# Patient Record
Sex: Male | Born: 1995 | Race: White | Hispanic: No | Smoking: Current every day smoker
Health system: Southern US, Community
[De-identification: ages and names within clinical notes are randomized; demographics above are authoritative.]

## PROBLEM LIST (undated history)

## (undated) HISTORY — PX: BACK SURGERY: SHX140

---

## 2020-05-14 ENCOUNTER — Encounter (HOSPITAL_COMMUNITY): Payer: Self-pay | Admitting: Emergency Medicine

## 2020-05-14 ENCOUNTER — Emergency Department (HOSPITAL_COMMUNITY): Payer: BC Managed Care – PPO

## 2020-05-14 ENCOUNTER — Inpatient Hospital Stay (HOSPITAL_COMMUNITY)
Admission: EM | Admit: 2020-05-14 | Discharge: 2020-05-25 | DRG: 029 | Disposition: A | Discharge status: Rehabilitation Facility | Payer: BC Managed Care – PPO | Attending: Surgery | Admitting: Surgery

## 2020-05-14 DIAGNOSIS — S32028A Other fracture of second lumbar vertebra, initial encounter for closed fracture: Secondary | ICD-10-CM | POA: Diagnosis present

## 2020-05-14 DIAGNOSIS — T1490XA Injury, unspecified, initial encounter: Secondary | ICD-10-CM

## 2020-05-14 DIAGNOSIS — S27321A Contusion of lung, unilateral, initial encounter: Secondary | ICD-10-CM | POA: Diagnosis present

## 2020-05-14 DIAGNOSIS — M48061 Spinal stenosis, lumbar region without neurogenic claudication: Secondary | ICD-10-CM | POA: Diagnosis present

## 2020-05-14 DIAGNOSIS — Z419 Encounter for procedure for purposes other than remedying health state, unspecified: Secondary | ICD-10-CM

## 2020-05-14 DIAGNOSIS — S32012A Unstable burst fracture of first lumbar vertebra, initial encounter for closed fracture: Secondary | ICD-10-CM | POA: Diagnosis present

## 2020-05-14 DIAGNOSIS — S0502XA Injury of conjunctiva and corneal abrasion without foreign body, left eye, initial encounter: Secondary | ICD-10-CM | POA: Diagnosis present

## 2020-05-14 DIAGNOSIS — S32011D Stable burst fracture of first lumbar vertebra, subsequent encounter for fracture with routine healing: Secondary | ICD-10-CM | POA: Diagnosis not present

## 2020-05-14 DIAGNOSIS — S3991XA Unspecified injury of abdomen, initial encounter: Secondary | ICD-10-CM

## 2020-05-14 DIAGNOSIS — S3421XA Injury of nerve root of lumbar spine, initial encounter: Secondary | ICD-10-CM | POA: Diagnosis present

## 2020-05-14 DIAGNOSIS — S34101A Unspecified injury to L1 level of lumbar spinal cord, initial encounter: Secondary | ICD-10-CM

## 2020-05-14 DIAGNOSIS — Z20822 Contact with and (suspected) exposure to covid-19: Secondary | ICD-10-CM | POA: Diagnosis present

## 2020-05-14 DIAGNOSIS — D62 Acute posthemorrhagic anemia: Secondary | ICD-10-CM | POA: Diagnosis not present

## 2020-05-14 DIAGNOSIS — Y92410 Unspecified street and highway as the place of occurrence of the external cause: Secondary | ICD-10-CM

## 2020-05-14 DIAGNOSIS — S32038A Other fracture of third lumbar vertebra, initial encounter for closed fracture: Secondary | ICD-10-CM | POA: Diagnosis present

## 2020-05-14 DIAGNOSIS — S32019A Unspecified fracture of first lumbar vertebra, initial encounter for closed fracture: Secondary | ICD-10-CM

## 2020-05-14 LAB — CBC
HCT: 41 % (ref 39.0–52.0)
Hemoglobin: 13 g/dL (ref 13.0–17.0)
MCH: 27.4 pg (ref 26.0–34.0)
MCHC: 31.7 g/dL (ref 30.0–36.0)
MCV: 86.3 fL (ref 80.0–100.0)
Platelets: 468 10*3/uL — ABNORMAL HIGH (ref 150–400)
RBC: 4.75 MIL/uL (ref 4.22–5.81)
RDW: 14.5 % (ref 11.5–15.5)
WBC: 17.3 10*3/uL — ABNORMAL HIGH (ref 4.0–10.5)
nRBC: 0 % (ref 0.0–0.2)

## 2020-05-14 LAB — COMPREHENSIVE METABOLIC PANEL
ALT: 124 U/L — ABNORMAL HIGH (ref 0–44)
AST: 123 U/L — ABNORMAL HIGH (ref 15–41)
Albumin: 3.6 g/dL (ref 3.5–5.0)
Alkaline Phosphatase: 66 U/L (ref 38–126)
Anion gap: 10 (ref 5–15)
BUN: 13 mg/dL (ref 6–20)
CO2: 23 mmol/L (ref 22–32)
Calcium: 9.1 mg/dL (ref 8.9–10.3)
Chloride: 106 mmol/L (ref 98–111)
Creatinine, Ser: 0.91 mg/dL (ref 0.61–1.24)
GFR, Estimated: 57 mL/min — ABNORMAL LOW (ref >60)
Glucose, Bld: 122 mg/dL — ABNORMAL HIGH (ref 70–99)
Potassium: 3.4 mmol/L — ABNORMAL LOW (ref 3.5–5.1)
Sodium: 139 mmol/L (ref 135–145)
Total Bilirubin: 0.5 mg/dL (ref 0.3–1.2)
Total Protein: 6.7 g/dL (ref 6.5–8.1)

## 2020-05-14 LAB — I-STAT CHEM 8, ED
BUN: 15 mg/dL (ref 6–20)
Calcium, Ion: 1.14 mmol/L — ABNORMAL LOW (ref 1.15–1.40)
Chloride: 106 mmol/L (ref 98–111)
Creatinine, Ser: 0.7 mg/dL (ref 0.61–1.24)
Glucose, Bld: 120 mg/dL — ABNORMAL HIGH (ref 70–99)
HCT: 40 % (ref 39.0–52.0)
Hemoglobin: 13.6 g/dL (ref 13.0–17.0)
Potassium: 3.4 mmol/L — ABNORMAL LOW (ref 3.5–5.1)
Sodium: 142 mmol/L (ref 135–145)
TCO2: 23 mmol/L (ref 22–32)

## 2020-05-14 LAB — LACTIC ACID, PLASMA: Lactic Acid, Venous: 2.2 mmol/L (ref 0.5–1.9)

## 2020-05-14 LAB — PROTIME-INR
INR: 1 (ref 0.8–1.2)
Prothrombin Time: 12.8 seconds (ref 11.4–15.2)

## 2020-05-14 LAB — ETHANOL: Alcohol, Ethyl (B): <10 mg/dL (ref <10)

## 2020-05-14 LAB — RESP PANEL BY RT-PCR (FLU A&B, COVID) ARPGX2
Influenza A by PCR: NEGATIVE
Influenza B by PCR: NEGATIVE
SARS Coronavirus 2 by RT PCR: NEGATIVE

## 2020-05-14 LAB — SAMPLE TO BLOOD BANK

## 2020-05-14 MED ORDER — ACETAMINOPHEN 10 MG/ML IV SOLN
1000.0000 mg | Freq: Four times a day (QID) | INTRAVENOUS | Status: AC
  Administered 2020-05-14 – 2020-05-15 (×3): 1000 mg via INTRAVENOUS
  Filled 2020-05-14 (×5): qty 100

## 2020-05-14 MED ORDER — HYDROMORPHONE HCL 1 MG/ML IJ SOLN
INTRAMUSCULAR | Status: AC
  Filled 2020-05-14: qty 1

## 2020-05-14 MED ORDER — METHOCARBAMOL 1000 MG/10ML IJ SOLN
1000.0000 mg | Freq: Three times a day (TID) | INTRAVENOUS | Status: DC
  Administered 2020-05-15 – 2020-05-17 (×6): 1000 mg via INTRAVENOUS
  Filled 2020-05-14 (×17): qty 10

## 2020-05-14 MED ORDER — ONDANSETRON HCL 4 MG/2ML IJ SOLN
INTRAMUSCULAR | Status: AC
  Administered 2020-05-14: 4 mg
  Filled 2020-05-14: qty 2

## 2020-05-14 MED ORDER — DIPHENHYDRAMINE HCL 12.5 MG/5ML PO ELIX: 12.5000 mg | ORAL_SOLUTION | Freq: Four times a day (QID) | ORAL | Status: DC | PRN

## 2020-05-14 MED ORDER — HYDROMORPHONE HCL 1 MG/ML IJ SOLN
INTRAMUSCULAR | Status: AC | PRN
  Administered 2020-05-14 (×2): 0.5 mg via INTRAVENOUS

## 2020-05-14 MED ORDER — HYDROMORPHONE HCL 1 MG/ML IJ SOLN: 1.0000 mg | INTRAMUSCULAR | Status: DC | PRN

## 2020-05-14 MED ORDER — ONDANSETRON HCL 4 MG/2ML IJ SOLN: 4.0000 mg | Freq: Four times a day (QID) | INTRAMUSCULAR | Status: DC | PRN

## 2020-05-14 MED ORDER — SODIUM CHLORIDE 0.9 % IV SOLN: INTRAVENOUS | Status: DC

## 2020-05-14 MED ORDER — ONDANSETRON 4 MG PO TBDP: 4.0000 mg | ORAL_TABLET | Freq: Four times a day (QID) | ORAL | Status: DC | PRN

## 2020-05-14 MED ORDER — NALOXONE HCL 0.4 MG/ML IJ SOLN: 0.4000 mg | INTRAMUSCULAR | Status: DC | PRN

## 2020-05-14 MED ORDER — FENTANYL CITRATE (PF) 100 MCG/2ML IJ SOLN
INTRAMUSCULAR | Status: AC | PRN
  Administered 2020-05-14 (×2): 50 ug via INTRAVENOUS

## 2020-05-14 MED ORDER — HYDROMORPHONE 1 MG/ML IV SOLN
INTRAVENOUS | Status: DC
  Administered 2020-05-15: 4.1 mg via INTRAVENOUS
  Filled 2020-05-14: qty 30

## 2020-05-14 MED ORDER — HYDROMORPHONE HCL 1 MG/ML IJ SOLN
1.0000 mg | Freq: Once | INTRAMUSCULAR | Status: AC
  Administered 2020-05-14: 1 mg via INTRAVENOUS

## 2020-05-14 MED ORDER — SODIUM CHLORIDE 0.9% FLUSH: 9.0000 mL | INTRAVENOUS | Status: DC | PRN

## 2020-05-14 MED ORDER — FENTANYL CITRATE (PF) 100 MCG/2ML IJ SOLN
INTRAMUSCULAR | Status: AC
  Filled 2020-05-14: qty 2

## 2020-05-14 MED ORDER — IOHEXOL 300 MG/ML  SOLN
100.0000 mL | Freq: Once | INTRAMUSCULAR | Status: AC | PRN
  Administered 2020-05-14: 100 mL via INTRAVENOUS

## 2020-05-14 MED ORDER — FENTANYL CITRATE (PF) 100 MCG/2ML IJ SOLN
50.0000 ug | INTRAMUSCULAR | Status: DC | PRN
  Administered 2020-05-14: 50 ug via INTRAVENOUS

## 2020-05-14 MED ORDER — CEFAZOLIN SODIUM-DEXTROSE 2-4 GM/100ML-% IV SOLN
2.0000 g | INTRAVENOUS | Status: AC
  Administered 2020-05-15: 2 g via INTRAVENOUS
  Filled 2020-05-14: qty 100

## 2020-05-14 MED ORDER — DIPHENHYDRAMINE HCL 50 MG/ML IJ SOLN: 12.5000 mg | Freq: Four times a day (QID) | INTRAMUSCULAR | Status: DC | PRN
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
## 2020-05-14 NOTE — ED Notes (Signed)
Mother -- [name]-- [phone]

## 2020-05-14 NOTE — ED Notes (Signed)
Neurosurg PA at bedside 

## 2020-05-14 NOTE — ED Notes (Signed)
Attempted report x1. 
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
## 2020-05-14 NOTE — ED Notes (Signed)
TO CT- delay in CT due to stroke on table

## 2020-05-14 NOTE — ED Notes (Signed)
Returned from CT- pt is able to move toes on both feet- still c/o numbness in both legs, more in right leg
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
## 2020-05-15 ENCOUNTER — Inpatient Hospital Stay (HOSPITAL_COMMUNITY): Admission: EM | Disposition: A | Discharge status: Rehabilitation Facility | Payer: Self-pay | Source: Home / Self Care

## 2020-05-15 ENCOUNTER — Inpatient Hospital Stay (HOSPITAL_COMMUNITY): Payer: BC Managed Care – PPO | Admitting: Certified Registered Nurse Anesthetist

## 2020-05-15 ENCOUNTER — Inpatient Hospital Stay (HOSPITAL_COMMUNITY): Payer: BC Managed Care – PPO

## 2020-05-15 ENCOUNTER — Encounter (HOSPITAL_COMMUNITY): Payer: Self-pay

## 2020-05-15 HISTORY — PX: LUMBAR LAMINECTOMY/DECOMPRESSION MICRODISCECTOMY: SHX5026

## 2020-05-15 LAB — CBC
HCT: 35.8 % — ABNORMAL LOW (ref 39.0–52.0)
Hemoglobin: 12 g/dL — ABNORMAL LOW (ref 13.0–17.0)
MCH: 27.9 pg (ref 26.0–34.0)
MCHC: 33.5 g/dL (ref 30.0–36.0)
MCV: 83.3 fL (ref 80.0–100.0)
Platelets: 405 10*3/uL — ABNORMAL HIGH (ref 150–400)
RBC: 4.3 MIL/uL (ref 4.22–5.81)
RDW: 14.7 % (ref 11.5–15.5)
WBC: 25 10*3/uL — ABNORMAL HIGH (ref 4.0–10.5)
nRBC: 0 % (ref 0.0–0.2)

## 2020-05-15 LAB — URINALYSIS, ROUTINE W REFLEX MICROSCOPIC
Bilirubin Urine: NEGATIVE
Glucose, UA: NEGATIVE mg/dL
Hgb urine dipstick: NEGATIVE
Ketones, ur: NEGATIVE mg/dL
Leukocytes,Ua: NEGATIVE
Nitrite: NEGATIVE
Protein, ur: NEGATIVE mg/dL
Specific Gravity, Urine: >1.046 — ABNORMAL HIGH (ref 1.005–1.030)
pH: 5 (ref 5.0–8.0)

## 2020-05-15 LAB — BASIC METABOLIC PANEL
Anion gap: 12 (ref 5–15)
BUN: 11 mg/dL (ref 6–20)
CO2: 22 mmol/L (ref 22–32)
Calcium: 8.9 mg/dL (ref 8.9–10.3)
Chloride: 105 mmol/L (ref 98–111)
Creatinine, Ser: 0.79 mg/dL (ref 0.61–1.24)
GFR, Estimated: >60 mL/min (ref >60)
Glucose, Bld: 109 mg/dL — ABNORMAL HIGH (ref 70–99)
Potassium: 4 mmol/L (ref 3.5–5.1)
Sodium: 139 mmol/L (ref 135–145)

## 2020-05-15 LAB — SURGICAL PCR SCREEN
MRSA, PCR: NEGATIVE
Staphylococcus aureus: POSITIVE — AB

## 2020-05-15 LAB — LIPASE, BLOOD: Lipase: 25 U/L (ref 11–51)

## 2020-05-15 SURGERY — LUMBAR LAMINECTOMY/DECOMPRESSION MICRODISCECTOMY 1 LEVEL
Anesthesia: General | Site: Back

## 2020-05-15 MED ORDER — CHLORHEXIDINE GLUCONATE 0.12 % MT SOLN: 15.0000 mL | Freq: Once | OROMUCOSAL | Status: AC

## 2020-05-15 MED ORDER — KETAMINE HCL 10 MG/ML IJ SOLN
INTRAMUSCULAR | Status: DC | PRN
  Administered 2020-05-15 (×2): 10 mg via INTRAVENOUS
  Administered 2020-05-15: 30 mg via INTRAVENOUS

## 2020-05-15 MED ORDER — PHENYLEPHRINE 40 MCG/ML (10ML) SYRINGE FOR IV PUSH (FOR BLOOD PRESSURE SUPPORT)
PREFILLED_SYRINGE | INTRAVENOUS | Status: AC
  Filled 2020-05-15: qty 10

## 2020-05-15 MED ORDER — HYDROMORPHONE HCL 1 MG/ML IJ SOLN
0.2500 mg | INTRAMUSCULAR | Status: DC | PRN
  Administered 2020-05-15 (×4): 0.5 mg via INTRAVENOUS

## 2020-05-15 MED ORDER — PROPOFOL 10 MG/ML IV BOLUS
INTRAVENOUS | Status: AC
  Filled 2020-05-15: qty 40

## 2020-05-15 MED ORDER — SENNA 8.6 MG PO TABS
1.0000 | ORAL_TABLET | Freq: Two times a day (BID) | ORAL | Status: DC
  Administered 2020-05-16 – 2020-05-25 (×19): 8.6 mg via ORAL
  Filled 2020-05-15 (×19): qty 1

## 2020-05-15 MED ORDER — DOCUSATE SODIUM 100 MG PO CAPS
100.0000 mg | ORAL_CAPSULE | Freq: Two times a day (BID) | ORAL | Status: DC
  Administered 2020-05-16 – 2020-05-25 (×19): 100 mg via ORAL
  Filled 2020-05-15 (×19): qty 1

## 2020-05-15 MED ORDER — LIDOCAINE 2% (20 MG/ML) 5 ML SYRINGE
INTRAMUSCULAR | Status: DC | PRN
  Administered 2020-05-15: 60 mg via INTRAVENOUS

## 2020-05-15 MED ORDER — PHENYLEPHRINE HCL-NACL 10-0.9 MG/250ML-% IV SOLN
INTRAVENOUS | Status: DC | PRN
  Administered 2020-05-15: 50 ug/min via INTRAVENOUS

## 2020-05-15 MED ORDER — BACITRACIN ZINC 500 UNIT/GM EX OINT
TOPICAL_OINTMENT | CUTANEOUS | Status: AC
  Filled 2020-05-15: qty 28.35

## 2020-05-15 MED ORDER — MIDAZOLAM HCL 2 MG/2ML IJ SOLN
INTRAMUSCULAR | Status: DC | PRN
  Administered 2020-05-15: 2 mg via INTRAVENOUS

## 2020-05-15 MED ORDER — ONDANSETRON HCL 4 MG/2ML IJ SOLN: 4.0000 mg | Freq: Four times a day (QID) | INTRAMUSCULAR | Status: DC | PRN

## 2020-05-15 MED ORDER — MENTHOL 3 MG MT LOZG
1.0000 | LOZENGE | OROMUCOSAL | Status: DC | PRN
Start: 2020-05-15 — End: 2020-05-25

## 2020-05-15 MED ORDER — ROCURONIUM BROMIDE 10 MG/ML (PF) SYRINGE
PREFILLED_SYRINGE | INTRAVENOUS | Status: AC
  Filled 2020-05-15: qty 10

## 2020-05-15 MED ORDER — KETAMINE HCL 50 MG/5ML IJ SOSY
PREFILLED_SYRINGE | INTRAMUSCULAR | Status: AC
  Filled 2020-05-15: qty 5

## 2020-05-15 MED ORDER — CHLORHEXIDINE GLUCONATE 0.12 % MT SOLN
OROMUCOSAL | Status: AC
  Administered 2020-05-15: 15 mL via OROMUCOSAL
  Filled 2020-05-15: qty 15

## 2020-05-15 MED ORDER — MUPIROCIN 2 % EX OINT
1.0000 "application " | TOPICAL_OINTMENT | Freq: Two times a day (BID) | CUTANEOUS | Status: AC
  Administered 2020-05-15 – 2020-05-19 (×9): 1 via NASAL
  Filled 2020-05-15 (×2): qty 22

## 2020-05-15 MED ORDER — MIDAZOLAM HCL 2 MG/2ML IJ SOLN
INTRAMUSCULAR | Status: AC
  Filled 2020-05-15: qty 2

## 2020-05-15 MED ORDER — HYDROMORPHONE HCL 1 MG/ML IJ SOLN
INTRAMUSCULAR | Status: AC
  Administered 2020-05-15: 1 mg
  Filled 2020-05-15: qty 1

## 2020-05-15 MED ORDER — ONDANSETRON HCL 4 MG PO TABS: 4.0000 mg | ORAL_TABLET | Freq: Four times a day (QID) | ORAL | Status: DC | PRN

## 2020-05-15 MED ORDER — FENTANYL CITRATE (PF) 250 MCG/5ML IJ SOLN
INTRAMUSCULAR | Status: AC
  Filled 2020-05-15: qty 5

## 2020-05-15 MED ORDER — HYDROMORPHONE HCL 1 MG/ML IJ SOLN: 1.0000 mg | Freq: Once | INTRAMUSCULAR | Status: AC

## 2020-05-15 MED ORDER — ONDANSETRON HCL 4 MG/2ML IJ SOLN
INTRAMUSCULAR | Status: AC
  Filled 2020-05-15: qty 2

## 2020-05-15 MED ORDER — FLEET ENEMA 7-19 GM/118ML RE ENEM
1.0000 | ENEMA | Freq: Once | RECTAL | Status: AC | PRN
  Administered 2020-05-22: 1 via RECTAL
  Filled 2020-05-15: qty 1

## 2020-05-15 MED ORDER — CHLORHEXIDINE GLUCONATE CLOTH 2 % EX PADS
6.0000 | MEDICATED_PAD | Freq: Every day | CUTANEOUS | Status: DC
  Administered 2020-05-17 – 2020-05-25 (×9): 6 via TOPICAL

## 2020-05-15 MED ORDER — THROMBIN 5000 UNITS EX SOLR
CUTANEOUS | Status: AC
  Filled 2020-05-15: qty 10000

## 2020-05-15 MED ORDER — DEXAMETHASONE SODIUM PHOSPHATE 10 MG/ML IJ SOLN
INTRAMUSCULAR | Status: AC
  Filled 2020-05-15: qty 1

## 2020-05-15 MED ORDER — DEXAMETHASONE SODIUM PHOSPHATE 10 MG/ML IJ SOLN
INTRAMUSCULAR | Status: DC | PRN
  Administered 2020-05-15: 5 mg via INTRAVENOUS

## 2020-05-15 MED ORDER — LIDOCAINE 2% (20 MG/ML) 5 ML SYRINGE
INTRAMUSCULAR | Status: AC
  Filled 2020-05-15: qty 5

## 2020-05-15 MED ORDER — CIPROFLOXACIN HCL 0.3 % OP SOLN
2.0000 [drp] | OPHTHALMIC | Status: DC
  Administered 2020-05-15 – 2020-05-22 (×13): 2 [drp] via OPHTHALMIC
  Filled 2020-05-15: qty 2.5

## 2020-05-15 MED ORDER — THROMBIN 5000 UNITS EX SOLR
OROMUCOSAL | Status: DC | PRN
  Administered 2020-05-15 (×2): 5 mL via TOPICAL

## 2020-05-15 MED ORDER — SODIUM CHLORIDE 0.9 % IV SOLN: INTRAVENOUS | Status: DC

## 2020-05-15 MED ORDER — GADOBUTROL 1 MMOL/ML IV SOLN
6.0000 mL | Freq: Once | INTRAVENOUS | Status: AC | PRN
  Administered 2020-05-15: 6 mL via INTRAVENOUS

## 2020-05-15 MED ORDER — BUPIVACAINE HCL (PF) 0.5 % IJ SOLN
INTRAMUSCULAR | Status: DC | PRN
  Administered 2020-05-15: 4 mL

## 2020-05-15 MED ORDER — LACTATED RINGERS IV SOLN: INTRAVENOUS | Status: DC | PRN

## 2020-05-15 MED ORDER — PHENYLEPHRINE 40 MCG/ML (10ML) SYRINGE FOR IV PUSH (FOR BLOOD PRESSURE SUPPORT)
PREFILLED_SYRINGE | INTRAVENOUS | Status: DC | PRN
  Administered 2020-05-15 (×2): 80 ug via INTRAVENOUS

## 2020-05-15 MED ORDER — SENNOSIDES-DOCUSATE SODIUM 8.6-50 MG PO TABS
1.0000 | ORAL_TABLET | Freq: Every evening | ORAL | Status: DC | PRN
  Administered 2020-05-18: 1 via ORAL
  Filled 2020-05-15: qty 1

## 2020-05-15 MED ORDER — ACETAMINOPHEN 325 MG PO TABS: 650.0000 mg | ORAL_TABLET | ORAL | Status: DC | PRN

## 2020-05-15 MED ORDER — HYDROMORPHONE 1 MG/ML IV SOLN
INTRAVENOUS | Status: DC
  Administered 2020-05-15: 1.5 mg via INTRAVENOUS
  Administered 2020-05-15: 4.2 mg via INTRAVENOUS
  Administered 2020-05-15: 6 mg via INTRAVENOUS
  Administered 2020-05-16 – 2020-05-17 (×2): 30 mg via INTRAVENOUS
  Administered 2020-05-17: 4.5 mg via INTRAVENOUS
  Filled 2020-05-15 (×2): qty 30

## 2020-05-15 MED ORDER — BUPIVACAINE HCL (PF) 0.5 % IJ SOLN
INTRAMUSCULAR | Status: AC
  Filled 2020-05-15: qty 30

## 2020-05-15 MED ORDER — METHOCARBAMOL 1000 MG/10ML IJ SOLN
500.0000 mg | Freq: Four times a day (QID) | INTRAVENOUS | Status: DC | PRN
  Filled 2020-05-15: qty 5

## 2020-05-15 MED ORDER — OXYCODONE HCL 5 MG PO TABS: 5.0000 mg | ORAL_TABLET | Freq: Once | ORAL | Status: DC | PRN

## 2020-05-15 MED ORDER — BISACODYL 10 MG RE SUPP
10.0000 mg | Freq: Every day | RECTAL | Status: DC | PRN
  Administered 2020-05-21: 10 mg via RECTAL
  Filled 2020-05-15 (×2): qty 1

## 2020-05-15 MED ORDER — 0.9 % SODIUM CHLORIDE (POUR BTL) OPTIME
TOPICAL | Status: DC | PRN
  Administered 2020-05-15: 1000 mL

## 2020-05-15 MED ORDER — SUGAMMADEX SODIUM 200 MG/2ML IV SOLN
INTRAVENOUS | Status: DC | PRN
  Administered 2020-05-15: 120 mg via INTRAVENOUS

## 2020-05-15 MED ORDER — OXYCODONE HCL 5 MG PO TABS: 5.0000 mg | ORAL_TABLET | ORAL | Status: DC | PRN

## 2020-05-15 MED ORDER — LIDOCAINE-EPINEPHRINE 1 %-1:100000 IJ SOLN
INTRAMUSCULAR | Status: DC | PRN
  Administered 2020-05-15: 4 mL via INTRADERMAL

## 2020-05-15 MED ORDER — LIDOCAINE-EPINEPHRINE 1 %-1:100000 IJ SOLN
INTRAMUSCULAR | Status: AC
  Filled 2020-05-15: qty 1

## 2020-05-15 MED ORDER — METHOCARBAMOL 500 MG PO TABS
500.0000 mg | ORAL_TABLET | Freq: Four times a day (QID) | ORAL | Status: DC | PRN
Start: 2020-05-15 — End: 2020-05-17

## 2020-05-15 MED ORDER — ACETAMINOPHEN 650 MG RE SUPP: 650.0000 mg | RECTAL | Status: DC | PRN

## 2020-05-15 MED ORDER — FENTANYL CITRATE (PF) 250 MCG/5ML IJ SOLN
INTRAMUSCULAR | Status: DC | PRN
  Administered 2020-05-15 (×5): 50 ug via INTRAVENOUS

## 2020-05-15 MED ORDER — PHENYLEPHRINE HCL-NACL 10-0.9 MG/250ML-% IV SOLN
INTRAVENOUS | Status: AC
  Filled 2020-05-15: qty 250

## 2020-05-15 MED ORDER — PROMETHAZINE HCL 25 MG/ML IJ SOLN: 6.2500 mg | INTRAMUSCULAR | Status: DC | PRN

## 2020-05-15 MED ORDER — CEFAZOLIN SODIUM-DEXTROSE 2-4 GM/100ML-% IV SOLN
2.0000 g | Freq: Three times a day (TID) | INTRAVENOUS | Status: AC
  Administered 2020-05-15 (×2): 2 g via INTRAVENOUS
  Filled 2020-05-15 (×2): qty 100

## 2020-05-15 MED ORDER — ACETAMINOPHEN 10 MG/ML IV SOLN: 1000.0000 mg | Freq: Once | INTRAVENOUS | Status: DC | PRN

## 2020-05-15 MED ORDER — ONDANSETRON HCL 4 MG/2ML IJ SOLN
INTRAMUSCULAR | Status: DC | PRN
  Administered 2020-05-15: 4 mg via INTRAVENOUS

## 2020-05-15 MED ORDER — GABAPENTIN 300 MG PO CAPS
300.0000 mg | ORAL_CAPSULE | Freq: Three times a day (TID) | ORAL | Status: DC
  Administered 2020-05-16: 300 mg via ORAL
  Filled 2020-05-15: qty 1

## 2020-05-15 MED ORDER — DEXMEDETOMIDINE (PRECEDEX) IN NS 20 MCG/5ML (4 MCG/ML) IV SYRINGE
PREFILLED_SYRINGE | INTRAVENOUS | Status: DC | PRN
  Administered 2020-05-15 (×2): 8 ug via INTRAVENOUS
  Administered 2020-05-15: 4 ug via INTRAVENOUS

## 2020-05-15 MED ORDER — PROPOFOL 10 MG/ML IV BOLUS
INTRAVENOUS | Status: DC | PRN
Start: 2020-05-15 — End: 2020-05-15
  Administered 2020-05-15: 150 mg via INTRAVENOUS

## 2020-05-15 MED ORDER — HYDROMORPHONE HCL 1 MG/ML IJ SOLN
INTRAMUSCULAR | Status: AC
  Filled 2020-05-15: qty 1

## 2020-05-15 MED ORDER — CEFAZOLIN SODIUM-DEXTROSE 2-3 GM-%(50ML) IV SOLR
INTRAVENOUS | Status: DC | PRN
  Administered 2020-05-15: 2 g via INTRAVENOUS

## 2020-05-15 MED ORDER — BACITRACIN ZINC 500 UNIT/GM EX OINT
TOPICAL_OINTMENT | CUTANEOUS | Status: DC | PRN
  Administered 2020-05-15: 1 via TOPICAL

## 2020-05-15 MED ORDER — ALBUMIN HUMAN 5 % IV SOLN: INTRAVENOUS | Status: DC | PRN

## 2020-05-15 MED ORDER — HYDROCODONE-ACETAMINOPHEN 5-325 MG PO TABS: 1.0000 | ORAL_TABLET | ORAL | Status: DC | PRN

## 2020-05-15 MED ORDER — HYDROMORPHONE HCL 1 MG/ML IJ SOLN
0.5000 mg | INTRAMUSCULAR | Status: DC | PRN
  Filled 2020-05-15: qty 1

## 2020-05-15 MED ORDER — SODIUM CHLORIDE 0.9% FLUSH
3.0000 mL | Freq: Two times a day (BID) | INTRAVENOUS | Status: DC
  Administered 2020-05-15 – 2020-05-25 (×19): 3 mL via INTRAVENOUS

## 2020-05-15 MED ORDER — ACETAMINOPHEN 500 MG PO TABS: 1000.0000 mg | ORAL_TABLET | Freq: Four times a day (QID) | ORAL | Status: DC

## 2020-05-15 MED ORDER — DEXMEDETOMIDINE (PRECEDEX) IN NS 20 MCG/5ML (4 MCG/ML) IV SYRINGE
PREFILLED_SYRINGE | INTRAVENOUS | Status: AC
  Filled 2020-05-15: qty 5

## 2020-05-15 MED ORDER — ACETAMINOPHEN 10 MG/ML IV SOLN
INTRAVENOUS | Status: AC
  Filled 2020-05-15: qty 100

## 2020-05-15 MED ORDER — OXYCODONE HCL 5 MG/5ML PO SOLN: 5.0000 mg | Freq: Once | ORAL | Status: DC | PRN

## 2020-05-15 MED ORDER — ROCURONIUM BROMIDE 10 MG/ML (PF) SYRINGE
PREFILLED_SYRINGE | INTRAVENOUS | Status: DC | PRN
  Administered 2020-05-15 (×2): 20 mg via INTRAVENOUS
  Administered 2020-05-15: 60 mg via INTRAVENOUS
  Administered 2020-05-15 (×3): 10 mg via INTRAVENOUS

## 2020-05-15 MED ORDER — PHENOL 1.4 % MT LIQD: 1.0000 | OROMUCOSAL | Status: DC | PRN

## 2020-05-15 MED ORDER — THROMBIN 5000 UNITS EX SOLR
CUTANEOUS | Status: AC
  Filled 2020-05-15: qty 5000

## 2020-05-15 MED ORDER — SODIUM CHLORIDE 0.9% FLUSH: 3.0000 mL | INTRAVENOUS | Status: DC | PRN

## 2020-05-15 MED ORDER — CEFAZOLIN SODIUM 1 G IJ SOLR
INTRAMUSCULAR | Status: AC
  Filled 2020-05-15: qty 20

## 2020-05-15 SURGICAL SUPPLY — 74 items
ADH SKN CLS APL DERMABOND .7 (GAUZE/BANDAGES/DRESSINGS) ×1
APL SKNCLS STERI-STRIP NONHPOA (GAUZE/BANDAGES/DRESSINGS)
BAND INSRT 18 STRL LF DISP RB (MISCELLANEOUS) ×2
BAND RUBBER #18 3X1/16 STRL (MISCELLANEOUS) ×4 IMPLANT
BENZOIN TINCTURE PRP APPL 2/3 (GAUZE/BANDAGES/DRESSINGS) IMPLANT
BLADE CLIPPER SURG (BLADE) IMPLANT
BLADE SURG 11 STRL SS (BLADE) ×2 IMPLANT
BUR MATCHSTICK NEURO 3.0 LAGG (BURR) ×2 IMPLANT
BUR PRECISION FLUTE 5.0 (BURR) ×2 IMPLANT
CANISTER SUCT 3000ML PPV (MISCELLANEOUS) ×2 IMPLANT
CARTRIDGE OIL MAESTRO DRILL (MISCELLANEOUS) ×1 IMPLANT
COVER WAND RF STERILE (DRAPES) ×2 IMPLANT
DECANTER SPIKE VIAL GLASS SM (MISCELLANEOUS) ×2 IMPLANT
DERMABOND ADVANCED (GAUZE/BANDAGES/DRESSINGS) ×1
DERMABOND ADVANCED .7 DNX12 (GAUZE/BANDAGES/DRESSINGS) ×1 IMPLANT
DIFFUSER DRILL AIR PNEUMATIC (MISCELLANEOUS) ×2 IMPLANT
DRAPE LAPAROTOMY 100X72X124 (DRAPES) ×2 IMPLANT
DRAPE MICROSCOPE LEICA (MISCELLANEOUS) ×2 IMPLANT
DRAPE SURG 17X23 STRL (DRAPES) ×2 IMPLANT
DRSG OPSITE POSTOP 3X4 (GAUZE/BANDAGES/DRESSINGS) ×2 IMPLANT
DRSG OPSITE POSTOP 4X10 (GAUZE/BANDAGES/DRESSINGS) ×2 IMPLANT
DURAPREP 26ML APPLICATOR (WOUND CARE) ×2 IMPLANT
ELECT REM PT RETURN 9FT ADLT (ELECTROSURGICAL) ×2
ELECTRODE REM PT RTRN 9FT ADLT (ELECTROSURGICAL) ×1 IMPLANT
EXTENDER TAB GUIDE SV 5.5/6.0 (INSTRUMENTS) ×32 IMPLANT
GAUZE 4X4 16PLY RFD (DISPOSABLE) IMPLANT
GAUZE SPONGE 4X4 12PLY STRL (GAUZE/BANDAGES/DRESSINGS) IMPLANT
GLOVE BIO SURGEON STRL SZ7.5 (GLOVE) IMPLANT
GLOVE BIOGEL M 6.5 STRL (GLOVE) ×2 IMPLANT
GLOVE BIOGEL M 7.0 STRL (GLOVE) ×4 IMPLANT
GLOVE BIOGEL PI IND STRL 7.5 (GLOVE) IMPLANT
GLOVE BIOGEL PI INDICATOR 7.5 (GLOVE)
GLOVE BIOGEL PI ORTHO PRO 7.5 (GLOVE) ×2
GLOVE ECLIPSE 7.0 STRL STRAW (GLOVE) ×4 IMPLANT
GLOVE EXAM NITRILE XL STR (GLOVE) IMPLANT
GLOVE PI ORTHO PRO STRL 7.5 (GLOVE) ×2 IMPLANT
GLOVE SURG UNDER POLY LF SZ7.5 (GLOVE) ×8 IMPLANT
GOWN STRL REUS W/ TWL LRG LVL3 (GOWN DISPOSABLE) ×6 IMPLANT
GOWN STRL REUS W/ TWL XL LVL3 (GOWN DISPOSABLE) IMPLANT
GOWN STRL REUS W/TWL 2XL LVL3 (GOWN DISPOSABLE) IMPLANT
GOWN STRL REUS W/TWL LRG LVL3 (GOWN DISPOSABLE) ×12
GOWN STRL REUS W/TWL XL LVL3 (GOWN DISPOSABLE)
GRAFT DURAGEN MATRIX 1WX1L (Tissue) ×2 IMPLANT
GUIDEWIRE BLUNT NT 450 (WIRE) ×16 IMPLANT
HEMOSTAT POWDER KIT SURGIFOAM (HEMOSTASIS) ×2 IMPLANT
KIT BASIN OR (CUSTOM PROCEDURE TRAY) ×2 IMPLANT
KIT TURNOVER KIT B (KITS) ×2 IMPLANT
NEEDLE HYPO 18GX1.5 BLUNT FILL (NEEDLE) IMPLANT
NEEDLE HYPO 22GX1.5 SAFETY (NEEDLE) ×2 IMPLANT
NEEDLE SPNL 18GX3.5 QUINCKE PK (NEEDLE) ×2 IMPLANT
NEEDLE TROCAR PAK (NEEDLE) ×4 IMPLANT
NS IRRIG 1000ML POUR BTL (IV SOLUTION) ×2 IMPLANT
OIL CARTRIDGE MAESTRO DRILL (MISCELLANEOUS) ×2
PACK CRANIOTOMY CUSTOM (CUSTOM PROCEDURE TRAY) ×2 IMPLANT
PACK LAMINECTOMY NEURO (CUSTOM PROCEDURE TRAY) IMPLANT
PAD ARMBOARD 7.5X6 YLW CONV (MISCELLANEOUS) ×6 IMPLANT
ROD STRT PERC 5.5X140 (Rod) ×4 IMPLANT
SCREW 6.5X40 VOYAGER MAS FNS (Screw) ×4 IMPLANT
SCREW MA FENS 4.5X40 (Screw) ×8 IMPLANT
SCREW MA FENS 5.5X40 (Screw) ×4 IMPLANT
SCREW SET 5.5/6.0MM SOLERA (Screw) ×16 IMPLANT
SEALANT ADHERUS EXTEND TIP (MISCELLANEOUS) ×2 IMPLANT
SPONGE LAP 4X18 RFD (DISPOSABLE) ×2 IMPLANT
SPONGE SURGIFOAM ABS GEL SZ50 (HEMOSTASIS) IMPLANT
STAPLER VISISTAT 35W (STAPLE) ×2 IMPLANT
STRIP CLOSURE SKIN 1/2X4 (GAUZE/BANDAGES/DRESSINGS) IMPLANT
SUT VIC AB 0 CT1 18XCR BRD8 (SUTURE) ×5 IMPLANT
SUT VIC AB 0 CT1 8-18 (SUTURE) ×10
SUT VIC AB 2-0 CT1 18 (SUTURE) IMPLANT
SUT VICRYL 3-0 RB1 18 ABS (SUTURE) ×4 IMPLANT
SYR 3ML LL SCALE MARK (SYRINGE) IMPLANT
TOWEL GREEN STERILE (TOWEL DISPOSABLE) ×2 IMPLANT
TOWEL GREEN STERILE FF (TOWEL DISPOSABLE) ×2 IMPLANT
WATER STERILE IRR 1000ML POUR (IV SOLUTION) ×2 IMPLANT
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
## 2020-05-15 NOTE — Progress Notes (Signed)
  NEUROSURGERY PROGRESS NOTE   CTSP RE: LLE weakness. Initially patient seen in PACU, able to move left leg proximally. Currently, pt reports severe back pain limiting movement but states he can feel his legs.  EXAM: BP 112/69   Pulse 86   Temp 98 F (36.7 C) (Oral)   Resp (!) 9   Ht 5\' 9"  (1.753 m)   Wt 61.2 kg   SpO2 96%   BMI 19.94 kg/m  Awake, somewhat drowsy, answers appropriately CN grossly intact Good strength BUE Good strength RLE Unable to PF/DF on left, pain limited weakness proximally but at least 3/5 hip flexion/knee ext Sensation grossly intact BLE to LT/pain  IMPRESSION: -Immediately postop L1 decompression, T11-L3 stabilization for unstable L1 burst fracture, appears to have distal LLE weakness.   PLAN: - Will get stat MRI T/L spine to evaluate for any ongoing compression (hematoma, etc)  Situation reviewed with patient and his mother at bedside.  , MD [facility]

## 2020-05-15 NOTE — Progress Notes (Signed)
Orthopedic Tech Progress Note Patient Details:  Thomas Hodge March 09, 1996 [id number] Ordered brace Patient ID: Thomas Hodge, male   DOB: [birth year], 25 y.o.   MRN: [id number]   Michelle Piper 05/15/2020, 2:15 PM

## 2020-05-15 NOTE — Progress Notes (Signed)
Pt arrived to 4NP-9 via bed. Pt A&Ox4. VSS. IV's intact and infusing. Foley intact. Pt in severe pain refusing to be turned. Belongings (one shoe) at bedside. Mother of pt at bedside. Pt oriented to unit. Call bell in reach, bed in lowest position, and bed alarm on.

## 2020-05-15 NOTE — Anesthesia Procedure Notes (Signed)
Procedure Name: Intubation Date/Time: 05/15/2020 7:50 AM Performed by: Tressia Miners, CRNA Pre-anesthesia Checklist: Patient identified, Emergency Drugs available, Suction available and Patient being monitored Patient Re-evaluated:Patient Re-evaluated prior to induction Oxygen Delivery Method: Circle System Utilized Preoxygenation: Pre-oxygenation with 100% oxygen Induction Type: IV induction Ventilation: Mask ventilation without difficulty Laryngoscope Size: Glidescope and 3 Grade View: Grade I Tube type: Oral Tube size: 7.5 mm Number of attempts: 1 Airway Equipment and Method: Stylet Placement Confirmation: ETT inserted through vocal cords under direct vision,  positive ETCO2 and breath sounds checked- equal and bilateral Secured at: 22 cm Tube secured with: Tape Dental Injury: Teeth and Oropharynx as per pre-operative assessment  Comments: Pt in C-collar, in-line neck stabilization maintained throughout intubation.
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
## 2020-05-15 NOTE — Progress Notes (Signed)
Eugenie Norrie, PA-C returned call.  NNO at this time.  Awaiting MRI transporter to take patient for MRI of spine.  Patient has remained NPO for potential surgical intervention based on MRI results.

## 2020-05-15 NOTE — ED Notes (Signed)
When prepping for foley catheter insertion, this writer attempted to clean stool from pt. However, pt refused to allow this writer to do so.

## 2020-05-15 NOTE — Transfer of Care (Signed)
Immediate Anesthesia Transfer of Care Note  Patient: Thomas Hodge  Procedure(s) Performed: LUMBAR ONE LAMINECTOMY FOR DECOMPRESSION; THORACIC ELEVEN -LUMBAR THREE FUSION (N/A Back)  Patient Location: PACU  Anesthesia Type:General  Level of Consciousness: sedated  Airway & Oxygen Therapy: Patient Spontanous Breathing and Patient connected to face mask oxygen  Post-op Assessment: Report given to RN, Post -op Vital signs reviewed and stable and Patient moving all extremities  Post vital signs: Reviewed and stable  Last Vitals:  Vitals Value Taken Time  BP 110/59 05/15/20 1200  Temp 36.9 C 05/15/20 1200  Pulse 95 05/15/20 1205  Resp 16 05/15/20 1205  SpO2 96 % 05/15/20 1205  Vitals shown include unvalidated device data.  Last Pain:  Vitals:   05/15/20 1200  TempSrc:   PainSc: Asleep         Complications: No complications documented.

## 2020-05-15 NOTE — Anesthesia Postprocedure Evaluation (Signed)
Anesthesia Post Note  Patient: Thomas Hodge  Procedure(s) Performed: LUMBAR ONE LAMINECTOMY FOR DECOMPRESSION; THORACIC ELEVEN -LUMBAR THREE FUSION (N/A Back)     Patient location during evaluation: PACU Anesthesia Type: General Level of consciousness: awake Pain management: pain level controlled Vital Signs Assessment: post-procedure vital signs reviewed and stable Respiratory status: spontaneous breathing, nonlabored ventilation, respiratory function stable and patient connected to nasal cannula oxygen Cardiovascular status: blood pressure returned to baseline and stable Postop Assessment: no apparent nausea or vomiting Anesthetic complications: no   No complications documented.  Last Vitals:  Vitals:   05/15/20 1455 05/15/20 1500  BP: 118/68 116/69  Pulse: 89 89  Resp: 11 13  Temp:    SpO2: 97% 97%    Last Pain:  Vitals:   05/15/20 1520  TempSrc:   PainSc: 3                  Emmalyne Giacomo P Annalis Kaczmarczyk

## 2020-05-15 NOTE — Progress Notes (Signed)
Pt returned to room from MRI.

## 2020-05-15 NOTE — Progress Notes (Signed)
Patient requesting "something for anxiety".  Also indicates that pain medication is not effective.  Have returned to room several times to find patient resting apparently comfortably.  Call placed to Dr. Conchita Paris or PA.  Awaiting return call.
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
## 2020-05-16 ENCOUNTER — Other Ambulatory Visit: Payer: Self-pay

## 2020-05-16 LAB — CBC
HCT: 31.1 % — ABNORMAL LOW (ref 39.0–52.0)
Hemoglobin: 10.6 g/dL — ABNORMAL LOW (ref 13.0–17.0)
MCH: 28 pg (ref 26.0–34.0)
MCHC: 34.1 g/dL (ref 30.0–36.0)
MCV: 82.3 fL (ref 80.0–100.0)
Platelets: 322 10*3/uL (ref 150–400)
RBC: 3.78 MIL/uL — ABNORMAL LOW (ref 4.22–5.81)
RDW: 14.6 % (ref 11.5–15.5)
WBC: 12.2 10*3/uL — ABNORMAL HIGH (ref 4.0–10.5)
nRBC: 0 % (ref 0.0–0.2)

## 2020-05-16 LAB — PROTIME-INR
INR: 1.1 (ref 0.8–1.2)
Prothrombin Time: 13.6 seconds (ref 11.4–15.2)

## 2020-05-16 LAB — BASIC METABOLIC PANEL
Anion gap: 9 (ref 5–15)
BUN: 7 mg/dL (ref 6–20)
CO2: 28 mmol/L (ref 22–32)
Calcium: 8.8 mg/dL — ABNORMAL LOW (ref 8.9–10.3)
Chloride: 100 mmol/L (ref 98–111)
Creatinine, Ser: 0.78 mg/dL (ref 0.61–1.24)
GFR, Estimated: >60 mL/min (ref >60)
Glucose, Bld: 98 mg/dL (ref 70–99)
Potassium: 3.9 mmol/L (ref 3.5–5.1)
Sodium: 137 mmol/L (ref 135–145)

## 2020-05-16 LAB — APTT: aPTT: 31 seconds (ref 24–36)

## 2020-05-16 MED ORDER — GABAPENTIN 300 MG PO CAPS
600.0000 mg | ORAL_CAPSULE | Freq: Three times a day (TID) | ORAL | Status: DC
  Administered 2020-05-16 – 2020-05-25 (×27): 600 mg via ORAL
  Filled 2020-05-16 (×27): qty 2

## 2020-05-16 MED ORDER — KETOROLAC TROMETHAMINE 15 MG/ML IJ SOLN
15.0000 mg | Freq: Once | INTRAMUSCULAR | Status: AC
  Administered 2020-05-16: 15 mg via INTRAVENOUS
  Filled 2020-05-16: qty 1
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
## 2020-05-16 NOTE — Progress Notes (Signed)
  NEUROSURGERY PROGRESS NOTE   No issues overnight. Pt cont to report severe back pain limiting movement of left leg.  EXAM:  BP 125/75   Pulse (!) 102   Temp 98.1 F (36.7 C) (Oral)   Resp 17   Ht 5\' 9"  (1.753 m)   Wt 61.2 kg   SpO2 98%   BMI 19.94 kg/m   Awake, alert, oriented  Speech fluent, appropriate  CN grossly intact  5/5 BUE/RLE >3/5 hip flexion/knee ext LLE with significant coaxing, no voluntary movement PF/DF Sensation intact BLE  IMPRESSION:  25 y.o. male POD#1 s/p L1 decompression, T11-L3 stabilization. Reports LLE weakness, intact sensation with MRI without hematoma or conus edema. There is residual bone fragment however somewhat improved from preop with the addition of dorsal decompression. Difficult to explain distal LLE weakness with intact sensation from L1 surgery.  PLAN: - PT/OT eval today - brace when upright  30, MD [facility]

## 2020-05-16 NOTE — Progress Notes (Incomplete)
Bladder scan result: .
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
## 2020-05-17 LAB — BASIC METABOLIC PANEL
Anion gap: 11 (ref 5–15)
BUN: 9 mg/dL (ref 6–20)
CO2: 25 mmol/L (ref 22–32)
Calcium: 8.5 mg/dL — ABNORMAL LOW (ref 8.9–10.3)
Chloride: 97 mmol/L — ABNORMAL LOW (ref 98–111)
Creatinine, Ser: 0.65 mg/dL (ref 0.61–1.24)
GFR, Estimated: >60 mL/min (ref >60)
Glucose, Bld: 121 mg/dL — ABNORMAL HIGH (ref 70–99)
Potassium: 3.8 mmol/L (ref 3.5–5.1)
Sodium: 133 mmol/L — ABNORMAL LOW (ref 135–145)

## 2020-05-17 LAB — CBC
HCT: 34.3 % — ABNORMAL LOW (ref 39.0–52.0)
Hemoglobin: 11 g/dL — ABNORMAL LOW (ref 13.0–17.0)
MCH: 27.5 pg (ref 26.0–34.0)
MCHC: 32.1 g/dL (ref 30.0–36.0)
MCV: 85.8 fL (ref 80.0–100.0)
Platelets: 361 10*3/uL (ref 150–400)
RBC: 4 MIL/uL — ABNORMAL LOW (ref 4.22–5.81)
RDW: 14.3 % (ref 11.5–15.5)
WBC: 11.7 10*3/uL — ABNORMAL HIGH (ref 4.0–10.5)
nRBC: 0 % (ref 0.0–0.2)

## 2020-05-17 MED ORDER — KETOROLAC TROMETHAMINE 15 MG/ML IJ SOLN: 15.0000 mg | Freq: Four times a day (QID) | INTRAMUSCULAR | Status: DC | PRN

## 2020-05-17 MED ORDER — METHOCARBAMOL 500 MG PO TABS
1000.0000 mg | ORAL_TABLET | Freq: Three times a day (TID) | ORAL | Status: DC
  Administered 2020-05-17 – 2020-05-25 (×26): 1000 mg via ORAL
  Filled 2020-05-17 (×26): qty 2

## 2020-05-17 MED ORDER — BETHANECHOL CHLORIDE 10 MG PO TABS
10.0000 mg | ORAL_TABLET | Freq: Three times a day (TID) | ORAL | Status: DC
  Administered 2020-05-17 – 2020-05-25 (×25): 10 mg via ORAL
  Filled 2020-05-17 (×25): qty 1

## 2020-05-17 MED ORDER — ENOXAPARIN SODIUM 30 MG/0.3ML ~~LOC~~ SOLN
30.0000 mg | Freq: Two times a day (BID) | SUBCUTANEOUS | Status: DC
  Administered 2020-05-17 – 2020-05-25 (×17): 30 mg via SUBCUTANEOUS
  Filled 2020-05-17 (×17): qty 0.3

## 2020-05-17 MED ORDER — KETOROLAC TROMETHAMINE 15 MG/ML IJ SOLN
15.0000 mg | Freq: Four times a day (QID) | INTRAMUSCULAR | Status: AC | PRN
  Administered 2020-05-17 – 2020-05-21 (×14): 15 mg via INTRAVENOUS
  Filled 2020-05-17 (×14): qty 1

## 2020-05-17 MED ORDER — HYDROMORPHONE HCL 1 MG/ML IJ SOLN
0.5000 mg | INTRAMUSCULAR | Status: DC | PRN
  Administered 2020-05-17 – 2020-05-18 (×7): 1 mg via INTRAVENOUS
  Filled 2020-05-17 (×7): qty 1

## 2020-05-17 MED ORDER — OXYCODONE HCL 5 MG PO TABS
5.0000 mg | ORAL_TABLET | ORAL | Status: DC | PRN
  Administered 2020-05-17 – 2020-05-18 (×5): 10 mg via ORAL
  Filled 2020-05-17 (×5): qty 2

## 2020-05-17 MED ORDER — ACETAMINOPHEN 500 MG PO TABS
1000.0000 mg | ORAL_TABLET | Freq: Once | ORAL | Status: AC
  Administered 2020-05-17: 1000 mg via ORAL
  Filled 2020-05-17: qty 2

## 2020-05-17 MED FILL — Thrombin For Soln 5000 Unit: CUTANEOUS | Qty: 5000 | Status: AC
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
## 2020-05-17 NOTE — Plan of Care (Signed)

## 2020-05-17 NOTE — Progress Notes (Addendum)
  NEUROSURGERY PROGRESS NOTE   No issues overnight.  EXAM:  BP 136/74 (BP Location: Right Arm)   Pulse (!) 107   Temp 98.4 F (36.9 C) (Oral)   Resp 14   Ht 5\' 9"  (1.753 m)   Wt 61.2 kg   SpO2 95%   BMI 19.94 kg/m   Awake, alert, oriented  Speech fluent, appropriate  CN grossly intact  5/5 BUE/RLE >3/5 hip flexion/knee ext LLE with significant coaxing, no voluntary movement PF/DF Sensation intact BLE  IMPRESSION/PLAN 25 y.o. male POD#2 s/p L1 decompression, T11-L3 stabilization. Reports LLE weakness, intact sensation with MRI without hematoma or conus edema. Difficult to explain distal LLE weakness with intact sensation from L1 surgery. - continue supportive care - PT/OT   ===================================================== I have seen and examined 30 and agree with the exam, impression, and plan as documented in the note by Weldon Inches, PA-C.  S/p L1 decompression, T11-L3 stabilization. Cont to report LLE weakness but states he "should be able to move it soon." MRI without hematoma or conus compression/edema. Remains difficult to explain his distal unilateral LLE weakness with intact sensation from L1 surgery physiologically.  - d/c PCA today, transition to oral pain meds with toradol PRN - cont PT/OT  Cindra Presume, MD [facility]

## 2020-05-18 ENCOUNTER — Encounter (HOSPITAL_COMMUNITY): Payer: Self-pay | Admitting: Neurosurgery

## 2020-05-18 LAB — BASIC METABOLIC PANEL
Anion gap: 8 (ref 5–15)
BUN: 11 mg/dL (ref 6–20)
CO2: 27 mmol/L (ref 22–32)
Calcium: 8.9 mg/dL (ref 8.9–10.3)
Chloride: 100 mmol/L (ref 98–111)
Creatinine, Ser: 0.76 mg/dL (ref 0.61–1.24)
GFR, Estimated: >60 mL/min (ref >60)
Glucose, Bld: 113 mg/dL — ABNORMAL HIGH (ref 70–99)
Potassium: 4.3 mmol/L (ref 3.5–5.1)
Sodium: 135 mmol/L (ref 135–145)

## 2020-05-18 LAB — CBC
HCT: 33.9 % — ABNORMAL LOW (ref 39.0–52.0)
Hemoglobin: 11 g/dL — ABNORMAL LOW (ref 13.0–17.0)
MCH: 27.8 pg (ref 26.0–34.0)
MCHC: 32.4 g/dL (ref 30.0–36.0)
MCV: 85.6 fL (ref 80.0–100.0)
Platelets: 368 10*3/uL (ref 150–400)
RBC: 3.96 MIL/uL — ABNORMAL LOW (ref 4.22–5.81)
RDW: 14.4 % (ref 11.5–15.5)
WBC: 11.1 10*3/uL — ABNORMAL HIGH (ref 4.0–10.5)
nRBC: 0 % (ref 0.0–0.2)

## 2020-05-18 MED ORDER — ACETAMINOPHEN 500 MG PO TABS
1000.0000 mg | ORAL_TABLET | Freq: Three times a day (TID) | ORAL | Status: DC
  Administered 2020-05-18 – 2020-05-25 (×23): 1000 mg via ORAL
  Filled 2020-05-18 (×23): qty 2

## 2020-05-18 MED ORDER — HYDROMORPHONE HCL 1 MG/ML IJ SOLN: 0.5000 mg | INTRAMUSCULAR | Status: DC | PRN

## 2020-05-18 MED ORDER — OXYCODONE HCL 5 MG PO TABS
10.0000 mg | ORAL_TABLET | ORAL | Status: DC | PRN
  Administered 2020-05-18 – 2020-05-25 (×40): 15 mg via ORAL
  Filled 2020-05-18 (×5): qty 3
  Filled 2020-05-18: qty 2
  Filled 2020-05-18 (×36): qty 3

## 2020-05-18 MED ORDER — HYDROMORPHONE HCL 1 MG/ML IJ SOLN
0.5000 mg | Freq: Four times a day (QID) | INTRAMUSCULAR | Status: DC | PRN
  Administered 2020-05-18 – 2020-05-19 (×4): 1 mg via INTRAVENOUS
  Filled 2020-05-18 (×4): qty 1

## 2020-05-18 NOTE — Progress Notes (Signed)
  NEUROSURGERY PROGRESS NOTE   No issues overnight.  Bandage saturated this am. Changed by nursing No changes  EXAM:  BP 140/76   Pulse 99   Temp (!) 97.5 F (36.4 C)   Resp 16   Ht 5\' 9"  (1.753 m)   Wt 61.2 kg   SpO2 100%   BMI 19.94 kg/m   Awake, alert, oriented  Speech fluent, appropriate  CN grossly intact  5/5 BUE/RLE >3/5 hip flexion/knee ext LLE with significant coaxing, no voluntary movement PF/DF Sensation intact BLE  IMPRESSION/PLAN 25 y.o. male POD#3 s/p L1 decompression, T11-L3 stabilization. Reports LLE weakness, intact sensation with MRI without hematoma or conus edema. Difficult to explain distal LLE weakness with intact sensation from L1 surgery. - continue supportive care - change bandage as needed - PT/OT

## 2020-05-18 NOTE — Progress Notes (Signed)
Patient refused to be turned for RN to see back dressing. Patient stated "I'll let you look after pain medication at one"   Will assess dressing after PRN medication given at 0100
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
## 2020-05-19 MED ORDER — TRAMADOL HCL 50 MG PO TABS
50.0000 mg | ORAL_TABLET | Freq: Four times a day (QID) | ORAL | Status: DC
  Administered 2020-05-19 – 2020-05-20 (×4): 50 mg via ORAL
  Filled 2020-05-19 (×4): qty 1

## 2020-05-19 MED ORDER — POLYETHYLENE GLYCOL 3350 17 G PO PACK: 17.0000 g | PACK | Freq: Every day | ORAL | Status: DC

## 2020-05-19 MED ORDER — HYDROMORPHONE HCL 1 MG/ML IJ SOLN
0.5000 mg | Freq: Two times a day (BID) | INTRAMUSCULAR | Status: DC | PRN
  Administered 2020-05-19 – 2020-05-20 (×2): 0.5 mg via INTRAVENOUS
  Filled 2020-05-19 (×2): qty 1

## 2020-05-19 MED ORDER — POLYETHYLENE GLYCOL 3350 17 G PO PACK
17.0000 g | PACK | Freq: Two times a day (BID) | ORAL | Status: DC
  Administered 2020-05-19 – 2020-05-25 (×13): 17 g via ORAL
  Filled 2020-05-19 (×13): qty 1

## 2020-05-19 NOTE — Progress Notes (Signed)
  NEUROSURGERY PROGRESS NOTE   No issues overnight.  Refuses to move LLE today due to "severe" pain. Requesting adjustment in pain medication.  EXAM:  BP 115/62 (BP Location: Left Arm)   Pulse 81   Temp 98.5 F (36.9 C) (Oral)   Resp 14   Ht 5\' 9"  (1.753 m)   Wt 61.2 kg   SpO2 98%   BMI 19.94 kg/m   Awake, alert, oriented  Speech fluent, appropriate  CN grossly intact  5/5 BUE RLE moving well. Refuses to move LLE Incision: won't let me look at it  IMPRESSION/PLAN 25 y.o. male POD#4s/p L1 decompression, T11-L3 stabilization. Reports LLE weakness, intact sensation with MRI without hematoma or conus edema. No clear explanation. Refuses to move LLE today due to pain. - continue supportive care  - change bandage as needed - PT/OT
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
## 2020-05-19 NOTE — Plan of Care (Signed)
  Problem: Education: Goal: Knowledge of General Education information will improve Description Including pain rating scale, medication(s)/side effects and non-pharmacologic comfort measures Outcome: Progressing   Problem: Health Behavior/Discharge Planning: Goal: Ability to manage health-related needs will improve Outcome: Progressing   Problem: Clinical Measurements: Goal: Ability to maintain clinical measurements within normal limits will improve Outcome: Progressing Goal: Will remain free from infection Outcome: Progressing Goal: Diagnostic test results will improve Outcome: Progressing Goal: Respiratory complications will improve Outcome: Progressing Goal: Cardiovascular complication will be avoided Outcome: Progressing   Problem: Activity: Goal: Risk for activity intolerance will decrease Outcome: Progressing   Problem: Nutrition: Goal: Adequate nutrition will be maintained Outcome: Progressing   Problem: Coping: Goal: Level of anxiety will decrease Outcome: Progressing   Problem: Elimination: Goal: Will not experience complications related to bowel motility Outcome: Progressing Goal: Will not experience complications related to urinary retention Outcome: Progressing   Problem: Pain Managment: Goal: General experience of comfort will improve Outcome: Progressing   Problem: Safety: Goal: Ability to remain free from injury will improve Outcome: Progressing   Problem: Skin Integrity: Goal: Risk for impaired skin integrity will decrease Outcome: Progressing   Problem: Education: Goal: Ability to verbalize activity precautions or restrictions will improve Outcome: Progressing Goal: Knowledge of the prescribed therapeutic regimen will improve Outcome: Progressing Goal: Understanding of discharge needs will improve Outcome: Progressing   Problem: Activity: Goal: Ability to avoid complications of mobility impairment will improve Outcome: Progressing Goal:  Ability to tolerate increased activity will improve Outcome: Progressing Goal: Will remain free from falls Outcome: Progressing   Problem: Bowel/Gastric: Goal: Gastrointestinal status for postoperative course will improve Outcome: Progressing   Problem: Clinical Measurements: Goal: Ability to maintain clinical measurements within normal limits will improve Outcome: Progressing Goal: Postoperative complications will be avoided or minimized Outcome: Progressing Goal: Diagnostic test results will improve Outcome: Progressing   Problem: Pain Management: Goal: Pain level will decrease Outcome: Progressing   Problem: Skin Integrity: Goal: Will show signs of wound healing Outcome: Progressing   Problem: Health Behavior/Discharge Planning: Goal: Identification of resources available to assist in meeting health care needs will improve Outcome: Progressing   Problem: Bladder/Genitourinary: Goal: Urinary functional status for postoperative course will improve Outcome: Progressing   Problem: Education: Goal: Knowledge of General Education information will improve Description Including pain rating scale, medication(s)/side effects and non-pharmacologic comfort measures Outcome: Progressing   Problem: Health Behavior/Discharge Planning: Goal: Ability to manage health-related needs will improve Outcome: Progressing   Problem: Clinical Measurements: Goal: Ability to maintain clinical measurements within normal limits will improve Outcome: Progressing Goal: Will remain free from infection Outcome: Progressing Goal: Diagnostic test results will improve Outcome: Progressing Goal: Respiratory complications will improve Outcome: Progressing Goal: Cardiovascular complication will be avoided Outcome: Progressing   Problem: Activity: Goal: Risk for activity intolerance will decrease Outcome: Progressing   Problem: Nutrition: Goal: Adequate nutrition will be maintained Outcome:  Progressing   Problem: Coping: Goal: Level of anxiety will decrease Outcome: Progressing   Problem: Elimination: Goal: Will not experience complications related to bowel motility Outcome: Progressing Goal: Will not experience complications related to urinary retention Outcome: Progressing   Problem: Pain Managment: Goal: General experience of comfort will improve Outcome: Progressing   Problem: Safety: Goal: Ability to remain free from injury will improve Outcome: Progressing   Problem: Skin Integrity: Goal: Risk for impaired skin integrity will decrease Outcome: Progressing   
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
## 2020-05-19 NOTE — Progress Notes (Signed)
Inpatient Rehabilitation Admissions Coordinator    Inpatient rehab consult received. I met with patient at bedside for assessment. Currently he is limited by pain. I will follow his progress to assist with planning his rehab venue options pending his progress with therapies. I currently have very limited beds available on CIR and he was made aware.  Danne Baxter, RN, MSN Rehab Admissions Coordinator [phone] 05/19/2020 2:08 PM

## 2020-05-20 LAB — CBC
HCT: 32.4 % — ABNORMAL LOW (ref 39.0–52.0)
Hemoglobin: 10.3 g/dL — ABNORMAL LOW (ref 13.0–17.0)
MCH: 27.2 pg (ref 26.0–34.0)
MCHC: 31.8 g/dL (ref 30.0–36.0)
MCV: 85.7 fL (ref 80.0–100.0)
Platelets: 398 10*3/uL (ref 150–400)
RBC: 3.78 MIL/uL — ABNORMAL LOW (ref 4.22–5.81)
RDW: 14.6 % (ref 11.5–15.5)
WBC: 10.8 10*3/uL — ABNORMAL HIGH (ref 4.0–10.5)
nRBC: 0 % (ref 0.0–0.2)

## 2020-05-20 LAB — BASIC METABOLIC PANEL
Anion gap: 9 (ref 5–15)
BUN: 14 mg/dL (ref 6–20)
CO2: 28 mmol/L (ref 22–32)
Calcium: 9 mg/dL (ref 8.9–10.3)
Chloride: 101 mmol/L (ref 98–111)
Creatinine, Ser: 0.79 mg/dL (ref 0.61–1.24)
GFR, Estimated: >60 mL/min (ref >60)
Glucose, Bld: 102 mg/dL — ABNORMAL HIGH (ref 70–99)
Potassium: 4.4 mmol/L (ref 3.5–5.1)
Sodium: 138 mmol/L (ref 135–145)

## 2020-05-20 MED ORDER — TRAMADOL HCL 50 MG PO TABS
100.0000 mg | ORAL_TABLET | Freq: Four times a day (QID) | ORAL | Status: DC
  Administered 2020-05-20 – 2020-05-25 (×21): 100 mg via ORAL
  Filled 2020-05-20 (×21): qty 2

## 2020-05-20 MED ORDER — PANTOPRAZOLE SODIUM 40 MG PO TBEC
40.0000 mg | DELAYED_RELEASE_TABLET | Freq: Every day | ORAL | Status: DC
Start: 2020-05-20 — End: 2020-05-25
  Administered 2020-05-20 – 2020-05-25 (×6): 40 mg via ORAL
  Filled 2020-05-20 (×6): qty 1

## 2020-05-20 MED ORDER — BISACODYL 10 MG RE SUPP
10.0000 mg | Freq: Once | RECTAL | Status: AC
  Administered 2020-05-20: 10 mg via RECTAL
  Filled 2020-05-20: qty 1

## 2020-05-20 NOTE — Progress Notes (Signed)
Attempted to see patient for Rehab consult Introduced myself to pt- was told that he didn't want to see anyone right now- explained cannot come back today- I will not be available until next Tuesday- he said that was fine, and he would "try" to see me then.   He was polite but firm he didn't want to be examined or woken up more- he fell back to sleep before I left the room..   Of note, R and L dorsiflexion of feet were at the exact same place (I.e same degrees of DF/PF- same angle overall) at rest when sleeping, which is unusual when  Patient has DF weakness I.e foot drop.   I would have expected to see his L foot in PF position at all times-with an obvious L foot drop;  it is not in that position. This makes me wonder about nonphysiological issues more than an undiagnosed SCI/radiculopathy.  Please Dr Berline Chough if there's anything else I can do to help before next Tuesday

## 2020-05-20 NOTE — Progress Notes (Signed)
PT Cancellation Note  Patient Details Name: Thomas Hodge MRN: [id number] DOB: 1995/10/31   Cancelled Treatment:    Reason Eval/Treat Not Completed: Pain limiting ability to participate - Pt complaining of severe back pain, states he just got comfortable and does not want to get up today. PT explained benefits of mobility and detriments of forgoing daily mobility, pt continues to decline PT efforts today. Will check back tomorrow per pt request.   Marye Round, PT Acute Rehabilitation Services Pager [phone]  Office [phone]    Truddie Coco 05/20/2020, 2:26 PM

## 2020-05-20 NOTE — Progress Notes (Signed)
Inpatient Rehabilitation Admissions Coordinator   I met with patient and his Mom at bedside. I discussed the goals and expectations of a possible CIR admit , but that patient currently not a a level to tolerate the intensity required nor is CIR bed available this week. I will follow his progress. Mom states she can hire assist at home when she works if needed.  Danne Baxter, RN, MSN Rehab Admissions Coordinator [phone] 05/20/2020 1:19 PM

## 2020-05-20 NOTE — Progress Notes (Signed)
Subjective: The patient is alert.  He is in no apparent distress.  His mother is at the bedside.  He feels his legs are getting stronger.  Objective: Vital signs in last 24 hours: Temp:  [98 F (36.7 C)-98.8 F (37.1 C)] 98.4 F (36.9 C) (02/17 1156) Pulse Rate:  [90] 90 (02/17 1156) Resp:  [12-15] 12 (02/17 1156) BP: (112-124)/(60-75) 115/60 (02/17 1156) SpO2:  [97 %-100 %] 97 % (02/17 1156) Estimated body mass index is 19.94 kg/m as calculated from the following:   Height as of this encounter: 5\' 9"  (1.753 m).   Weight as of this encounter: 61.2 kg.   Intake/Output from previous day: 02/16 0701 - 02/17 0700 In: 1270 [P.O.:1270] Out: 1900 [Urine:1900] Intake/Output this shift: Total I/O In: -  Out: 300 [Urine:300]  Physical exam Glasgow Coma Scale 15.  His strength is fairly normal on the right.  His left leg is weak.  Lab Results: Recent Labs    05/18/20 0336 05/20/20 0217  WBC 11.1* 10.8*  HGB 11.0* 10.3*  HCT 33.9* 32.4*  PLT 368 398   BMET Recent Labs    05/18/20 0336 05/20/20 0217  NA 135 138  K 4.3 4.4  CL 100 101  CO2 27 28  GLUCOSE 113* 102*  BUN 11 14  CREATININE 0.76 0.79  CALCIUM 8.9 9.0    Studies/Results: No results found.  Assessment/Plan: Postop day #6: The patient is stable.  LOS: 6 days     05/22/20 05/20/2020, 1:01 PM
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
## 2020-05-20 NOTE — Plan of Care (Signed)
  Problem: Education: Goal: Knowledge of General Education information will improve Description Including pain rating scale, medication(s)/side effects and non-pharmacologic comfort measures Outcome: Progressing   Problem: Health Behavior/Discharge Planning: Goal: Ability to manage health-related needs will improve Outcome: Progressing   Problem: Clinical Measurements: Goal: Ability to maintain clinical measurements within normal limits will improve Outcome: Progressing Goal: Will remain free from infection Outcome: Progressing Goal: Diagnostic test results will improve Outcome: Progressing Goal: Respiratory complications will improve Outcome: Progressing Goal: Cardiovascular complication will be avoided Outcome: Progressing   Problem: Activity: Goal: Risk for activity intolerance will decrease Outcome: Progressing   Problem: Nutrition: Goal: Adequate nutrition will be maintained Outcome: Progressing   Problem: Coping: Goal: Level of anxiety will decrease Outcome: Progressing   Problem: Elimination: Goal: Will not experience complications related to bowel motility Outcome: Progressing Goal: Will not experience complications related to urinary retention Outcome: Progressing   Problem: Pain Managment: Goal: General experience of comfort will improve Outcome: Progressing   Problem: Safety: Goal: Ability to remain free from injury will improve Outcome: Progressing   Problem: Skin Integrity: Goal: Risk for impaired skin integrity will decrease Outcome: Progressing   Problem: Education: Goal: Ability to verbalize activity precautions or restrictions will improve Outcome: Progressing Goal: Knowledge of the prescribed therapeutic regimen will improve Outcome: Progressing Goal: Understanding of discharge needs will improve Outcome: Progressing   Problem: Activity: Goal: Ability to avoid complications of mobility impairment will improve Outcome: Progressing Goal:  Ability to tolerate increased activity will improve Outcome: Progressing Goal: Will remain free from falls Outcome: Progressing   Problem: Bowel/Gastric: Goal: Gastrointestinal status for postoperative course will improve Outcome: Progressing   Problem: Clinical Measurements: Goal: Ability to maintain clinical measurements within normal limits will improve Outcome: Progressing Goal: Postoperative complications will be avoided or minimized Outcome: Progressing Goal: Diagnostic test results will improve Outcome: Progressing   Problem: Pain Management: Goal: Pain level will decrease Outcome: Progressing   Problem: Skin Integrity: Goal: Will show signs of wound healing Outcome: Progressing   Problem: Health Behavior/Discharge Planning: Goal: Identification of resources available to assist in meeting health care needs will improve Outcome: Progressing   Problem: Bladder/Genitourinary: Goal: Urinary functional status for postoperative course will improve Outcome: Progressing   Problem: Education: Goal: Knowledge of General Education information will improve Description Including pain rating scale, medication(s)/side effects and non-pharmacologic comfort measures Outcome: Progressing   Problem: Health Behavior/Discharge Planning: Goal: Ability to manage health-related needs will improve Outcome: Progressing   Problem: Clinical Measurements: Goal: Ability to maintain clinical measurements within normal limits will improve Outcome: Progressing Goal: Will remain free from infection Outcome: Progressing Goal: Diagnostic test results will improve Outcome: Progressing Goal: Respiratory complications will improve Outcome: Progressing Goal: Cardiovascular complication will be avoided Outcome: Progressing   Problem: Activity: Goal: Risk for activity intolerance will decrease Outcome: Progressing   Problem: Nutrition: Goal: Adequate nutrition will be maintained Outcome:  Progressing   Problem: Coping: Goal: Level of anxiety will decrease Outcome: Progressing   Problem: Elimination: Goal: Will not experience complications related to bowel motility Outcome: Progressing Goal: Will not experience complications related to urinary retention Outcome: Progressing   Problem: Pain Managment: Goal: General experience of comfort will improve Outcome: Progressing   Problem: Safety: Goal: Ability to remain free from injury will improve Outcome: Progressing   Problem: Skin Integrity: Goal: Risk for impaired skin integrity will decrease Outcome: Progressing   
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
## 2020-05-21 DIAGNOSIS — R29898 Other symptoms and signs involving the musculoskeletal system: Secondary | ICD-10-CM

## 2020-05-21 NOTE — Progress Notes (Signed)
Inpatient Rehabilitation Admissions Coordinator  I will follow up on Monday if patient remains in house. Dr Riley Kill has consulted today. TOC made aware, Raynelle Fanning RN CM.  Ottie Glazier, RN, MSN Rehab Admissions Coordinator [phone] 05/21/2020 2:13 PM

## 2020-05-21 NOTE — Plan of Care (Signed)
  Problem: Education: Goal: Knowledge of General Education information will improve Description Including pain rating scale, medication(s)/side effects and non-pharmacologic comfort measures Outcome: Progressing   Problem: Health Behavior/Discharge Planning: Goal: Ability to manage health-related needs will improve Outcome: Progressing   Problem: Clinical Measurements: Goal: Ability to maintain clinical measurements within normal limits will improve Outcome: Progressing Goal: Will remain free from infection Outcome: Progressing Goal: Diagnostic test results will improve Outcome: Progressing Goal: Respiratory complications will improve Outcome: Progressing Goal: Cardiovascular complication will be avoided Outcome: Progressing   Problem: Activity: Goal: Risk for activity intolerance will decrease Outcome: Progressing   Problem: Nutrition: Goal: Adequate nutrition will be maintained Outcome: Progressing   Problem: Coping: Goal: Level of anxiety will decrease Outcome: Progressing   Problem: Elimination: Goal: Will not experience complications related to bowel motility Outcome: Progressing Goal: Will not experience complications related to urinary retention Outcome: Progressing   Problem: Pain Managment: Goal: General experience of comfort will improve Outcome: Progressing   Problem: Safety: Goal: Ability to remain free from injury will improve Outcome: Progressing   Problem: Skin Integrity: Goal: Risk for impaired skin integrity will decrease Outcome: Progressing   Problem: Education: Goal: Ability to verbalize activity precautions or restrictions will improve Outcome: Progressing Goal: Knowledge of the prescribed therapeutic regimen will improve Outcome: Progressing Goal: Understanding of discharge needs will improve Outcome: Progressing   Problem: Activity: Goal: Ability to avoid complications of mobility impairment will improve Outcome: Progressing Goal:  Ability to tolerate increased activity will improve Outcome: Progressing Goal: Will remain free from falls Outcome: Progressing   Problem: Bowel/Gastric: Goal: Gastrointestinal status for postoperative course will improve Outcome: Progressing   Problem: Clinical Measurements: Goal: Ability to maintain clinical measurements within normal limits will improve Outcome: Progressing Goal: Postoperative complications will be avoided or minimized Outcome: Progressing Goal: Diagnostic test results will improve Outcome: Progressing   Problem: Pain Management: Goal: Pain level will decrease Outcome: Progressing   Problem: Skin Integrity: Goal: Will show signs of wound healing Outcome: Progressing   Problem: Health Behavior/Discharge Planning: Goal: Identification of resources available to assist in meeting health care needs will improve Outcome: Progressing   Problem: Bladder/Genitourinary: Goal: Urinary functional status for postoperative course will improve Outcome: Progressing   Problem: Education: Goal: Knowledge of General Education information will improve Description Including pain rating scale, medication(s)/side effects and non-pharmacologic comfort measures Outcome: Progressing   Problem: Health Behavior/Discharge Planning: Goal: Ability to manage health-related needs will improve Outcome: Progressing   Problem: Clinical Measurements: Goal: Ability to maintain clinical measurements within normal limits will improve Outcome: Progressing Goal: Will remain free from infection Outcome: Progressing Goal: Diagnostic test results will improve Outcome: Progressing Goal: Respiratory complications will improve Outcome: Progressing Goal: Cardiovascular complication will be avoided Outcome: Progressing   Problem: Activity: Goal: Risk for activity intolerance will decrease Outcome: Progressing   Problem: Nutrition: Goal: Adequate nutrition will be maintained Outcome:  Progressing   Problem: Coping: Goal: Level of anxiety will decrease Outcome: Progressing   Problem: Elimination: Goal: Will not experience complications related to bowel motility Outcome: Progressing Goal: Will not experience complications related to urinary retention Outcome: Progressing   Problem: Pain Managment: Goal: General experience of comfort will improve Outcome: Progressing   Problem: Safety: Goal: Ability to remain free from injury will improve Outcome: Progressing   Problem: Skin Integrity: Goal: Risk for impaired skin integrity will decrease Outcome: Progressing   
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
## 2020-05-21 NOTE — Progress Notes (Signed)
Inpatient Rehabilitation Admissions Coordinator  Noted great progress with therapy today. I do not have a CIR bed to admit him over the weekend. Depending on his progress, he may progress to d/c directly home. As noted yesterday, Mom states she could hire assist when she works if needed. I will follow up next week.  Ottie Glazier, RN, MSN Rehab Admissions Coordinator [phone] 05/21/2020 12:53 PM
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
## 2020-05-22 NOTE — Progress Notes (Signed)
Patient tolerating PO but still has not had an actual BM since surgery.  Dr. Sheliah Hatch aware.  Patient willing to take a fleets enema today, first was going to take after therapy, then after lunch, now states he will take it at 4:00pm.  Otherwise no changes in patient assessment.  Patient resting quietly at this time.  Sat up in the chair for one hour this shift so far.

## 2020-05-22 NOTE — Progress Notes (Signed)
Patient requested Fleets enema, gave to patient, after about 15 min he stated he couldn't hold it anymore, went to the bathroom, with no results.

## 2020-05-22 NOTE — Plan of Care (Addendum)
Patient ambulating with walker and brace with standby assist.  Still needing to have a bowel movement but passing gas, will encourage enema this morning.  Pain seems to be controlled with PO medications.  Patient hoping to go home soon.   Problem: Education: Goal: Knowledge of General Education information will improve Description: Including pain rating scale, medication(s)/side effects and non-pharmacologic comfort measures Outcome: Progressing   Problem: Health Behavior/Discharge Planning: Goal: Ability to manage health-related needs will improve Outcome: Progressing   Problem: Clinical Measurements: Goal: Ability to maintain clinical measurements within normal limits will improve Outcome: Progressing Goal: Will remain free from infection Outcome: Progressing Goal: Diagnostic test results will improve Outcome: Progressing Goal: Respiratory complications will improve Outcome: Progressing Goal: Cardiovascular complication will be avoided Outcome: Progressing   Problem: Activity: Goal: Risk for activity intolerance will decrease Outcome: Progressing   Problem: Nutrition: Goal: Adequate nutrition will be maintained Outcome: Progressing   Problem: Coping: Goal: Level of anxiety will decrease Outcome: Progressing   Problem: Elimination: Goal: Will not experience complications related to bowel motility Outcome: Progressing Goal: Will not experience complications related to urinary retention Outcome: Progressing   Problem: Pain Managment: Goal: General experience of comfort will improve Outcome: Progressing   Problem: Safety: Goal: Ability to remain free from injury will improve Outcome: Progressing   Problem: Skin Integrity: Goal: Risk for impaired skin integrity will decrease Outcome: Progressing   Problem: Education: Goal: Ability to verbalize activity precautions or restrictions will improve Outcome: Progressing Goal: Knowledge of the prescribed therapeutic  regimen will improve Outcome: Progressing Goal: Understanding of discharge needs will improve Outcome: Progressing   Problem: Activity: Goal: Ability to avoid complications of mobility impairment will improve Outcome: Progressing Goal: Ability to tolerate increased activity will improve Outcome: Progressing Goal: Will remain free from falls Outcome: Progressing   Problem: Bowel/Gastric: Goal: Gastrointestinal status for postoperative course will improve Outcome: Progressing   Problem: Clinical Measurements: Goal: Ability to maintain clinical measurements within normal limits will improve Outcome: Progressing Goal: Postoperative complications will be avoided or minimized Outcome: Progressing Goal: Diagnostic test results will improve Outcome: Progressing   Problem: Pain Management: Goal: Pain level will decrease Outcome: Progressing   Problem: Skin Integrity: Goal: Will show signs of wound healing Outcome: Progressing   Problem: Health Behavior/Discharge Planning: Goal: Identification of resources available to assist in meeting health care needs will improve Outcome: Progressing   Problem: Bladder/Genitourinary: Goal: Urinary functional status for postoperative course will improve Outcome: Progressing   Problem: Education: Goal: Knowledge of General Education information will improve Description: Including pain rating scale, medication(s)/side effects and non-pharmacologic comfort measures Outcome: Progressing   Problem: Health Behavior/Discharge Planning: Goal: Ability to manage health-related needs will improve Outcome: Progressing   Problem: Clinical Measurements: Goal: Ability to maintain clinical measurements within normal limits will improve Outcome: Progressing Goal: Will remain free from infection Outcome: Progressing Goal: Diagnostic test results will improve Outcome: Progressing Goal: Respiratory complications will improve Outcome: Progressing Goal:  Cardiovascular complication will be avoided Outcome: Progressing   Problem: Activity: Goal: Risk for activity intolerance will decrease Outcome: Progressing   Problem: Nutrition: Goal: Adequate nutrition will be maintained Outcome: Progressing   Problem: Coping: Goal: Level of anxiety will decrease Outcome: Progressing   Problem: Elimination: Goal: Will not experience complications related to bowel motility Outcome: Progressing Goal: Will not experience complications related to urinary retention Outcome: Progressing   Problem: Pain Managment: Goal: General experience of comfort will improve Outcome: Progressing   Problem: Safety: Goal: Ability to remain free from  injury will improve Outcome: Progressing   Problem: Skin Integrity: Goal: Risk for impaired skin integrity will decrease Outcome: Progressing
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
## 2020-05-23 MED ORDER — MAGNESIUM CITRATE PO SOLN
1.0000 | Freq: Once | ORAL | Status: AC
  Administered 2020-05-23: 1 via ORAL
  Filled 2020-05-23: qty 296
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
## 2020-05-24 MED ORDER — MAGNESIUM HYDROXIDE 400 MG/5ML PO SUSP
30.0000 mL | Freq: Once | ORAL | Status: AC
  Administered 2020-05-24: 30 mL via ORAL
  Filled 2020-05-24: qty 30

## 2020-05-24 NOTE — Progress Notes (Signed)
Inpatient Rehabilitation Admissions Coordinator    I met with patient at bedside and spoke with his Mom by phone per his request. Patient currently not covered under his Mom's Medical insurance. She is attempting to add him. He has Medicaid for family pending. I await updated therapy assessment today, and if he is still in need of a CIR admit, I should have bed hopefully in next day or two. I will follow up tomorrow. I have updated RN CCM, Julie.    , RN, MSN Rehab Admissions Coordinator [phone] 05/24/2020 11:54 AM  

## 2020-05-24 NOTE — TOC Progression Note (Signed)
Transition of Care [facility]) - Progression Note    Patient Details  Name: Thomas Hodge MRN: [id number] Date of Birth: 1995/08/21  [facility] CM/SW Contact  Glennon Mac, RN Phone Number: 05/24/2020, 4:13 PM  Clinical Narrative:  Spoke with pt's mom, [name], by phone: she requested update on patient's mobility, and plans for possible CIR.  She confirms that patient has 24h assistance at home, and states he is motivated to start rehab. Mom attempting to add patient to her insurance, but has not been successful with this.  Mom is also concerned that patient is not getting up and out of bed, and feels that this is affecting him mentally.  Offered emotional support to mom; will check with PT assigned to him to see when they are coming to get him up.   Expected Discharge Plan: IP Rehab Facility Barriers to Discharge: Continued Medical Work up  Expected Discharge Plan and Services Expected Discharge Plan: IP Rehab Facility   Discharge Planning Services: CM [facility]   Living arrangements for the past 2 months: Single Family Home                                       Social Determinants of Health (SDOH) Interventions    Readmission Risk Interventions No flowsheet data found.  Quintella Baton, RN, BSN  Trauma/Neuro ICU Case Manager [phone]
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
## 2020-05-24 NOTE — Progress Notes (Signed)
Pt's mom called requesting to speak with Child psychotherapist. This RN messaged Sidney Ace, who responded and agreed to give her a call.   Robina Ade, RN
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
## 2020-05-25 ENCOUNTER — Encounter (HOSPITAL_COMMUNITY): Payer: Self-pay | Admitting: Physical Medicine & Rehabilitation

## 2020-05-25 ENCOUNTER — Other Ambulatory Visit: Payer: Self-pay

## 2020-05-25 ENCOUNTER — Inpatient Hospital Stay (HOSPITAL_COMMUNITY)
Admission: RE | Admit: 2020-05-25 | Discharge: 2020-06-01 | DRG: 560 | Disposition: A | Discharge status: Home / Self Care | Payer: BC Managed Care – PPO | Source: Intra-hospital | Attending: Physical Medicine & Rehabilitation | Admitting: Physical Medicine & Rehabilitation

## 2020-05-25 ENCOUNTER — Encounter (HOSPITAL_COMMUNITY): Payer: Self-pay

## 2020-05-25 DIAGNOSIS — R339 Retention of urine, unspecified: Secondary | ICD-10-CM | POA: Diagnosis present

## 2020-05-25 DIAGNOSIS — S32011D Stable burst fracture of first lumbar vertebra, subsequent encounter for fracture with routine healing: Secondary | ICD-10-CM | POA: Diagnosis present

## 2020-05-25 DIAGNOSIS — N319 Neuromuscular dysfunction of bladder, unspecified: Secondary | ICD-10-CM | POA: Diagnosis present

## 2020-05-25 DIAGNOSIS — D62 Acute posthemorrhagic anemia: Secondary | ICD-10-CM | POA: Diagnosis present

## 2020-05-25 DIAGNOSIS — K592 Neurogenic bowel, not elsewhere classified: Secondary | ICD-10-CM | POA: Diagnosis present

## 2020-05-25 DIAGNOSIS — Y92410 Unspecified street and highway as the place of occurrence of the external cause: Secondary | ICD-10-CM | POA: Diagnosis not present

## 2020-05-25 DIAGNOSIS — F17290 Nicotine dependence, other tobacco product, uncomplicated: Secondary | ICD-10-CM | POA: Diagnosis present

## 2020-05-25 DIAGNOSIS — Z79899 Other long term (current) drug therapy: Secondary | ICD-10-CM | POA: Diagnosis not present

## 2020-05-25 DIAGNOSIS — Z981 Arthrodesis status: Secondary | ICD-10-CM | POA: Diagnosis not present

## 2020-05-25 DIAGNOSIS — S32001A Stable burst fracture of unspecified lumbar vertebra, initial encounter for closed fracture: Secondary | ICD-10-CM | POA: Diagnosis present

## 2020-05-25 DIAGNOSIS — S32001D Stable burst fracture of unspecified lumbar vertebra, subsequent encounter for fracture with routine healing: Secondary | ICD-10-CM

## 2020-05-25 LAB — CBC
HCT: 34.3 % — ABNORMAL LOW (ref 39.0–52.0)
Hemoglobin: 11.2 g/dL — ABNORMAL LOW (ref 13.0–17.0)
MCH: 27.2 pg (ref 26.0–34.0)
MCHC: 32.7 g/dL (ref 30.0–36.0)
MCV: 83.3 fL (ref 80.0–100.0)
Platelets: 473 10*3/uL — ABNORMAL HIGH (ref 150–400)
RBC: 4.12 MIL/uL — ABNORMAL LOW (ref 4.22–5.81)
RDW: 14.3 % (ref 11.5–15.5)
WBC: 10 10*3/uL (ref 4.0–10.5)
nRBC: 0 % (ref 0.0–0.2)

## 2020-05-25 LAB — URINALYSIS, COMPLETE (UACMP) WITH MICROSCOPIC
Bilirubin Urine: NEGATIVE
Glucose, UA: NEGATIVE mg/dL
Hgb urine dipstick: NEGATIVE
Ketones, ur: NEGATIVE mg/dL
Leukocytes,Ua: NEGATIVE
Nitrite: NEGATIVE
Protein, ur: NEGATIVE mg/dL
Specific Gravity, Urine: 1.015 (ref 1.005–1.030)
Squamous Epithelial / HPF: NONE SEEN (ref 0–5)
WBC, UA: NONE SEEN WBC/hpf (ref 0–5)
pH: 7.5 (ref 5.0–8.0)

## 2020-05-25 LAB — CREATININE, SERUM
Creatinine, Ser: 0.73 mg/dL (ref 0.61–1.24)
GFR, Estimated: >60 mL/min (ref >60)

## 2020-05-25 MED ORDER — ONDANSETRON HCL 4 MG/2ML IJ SOLN: 4.0000 mg | Freq: Four times a day (QID) | INTRAMUSCULAR | Status: DC | PRN

## 2020-05-25 MED ORDER — LIDOCAINE 5 % EX PTCH
1.0000 | MEDICATED_PATCH | CUTANEOUS | Status: DC
  Administered 2020-05-25: 1 via TRANSDERMAL
  Filled 2020-05-25: qty 1

## 2020-05-25 MED ORDER — ENOXAPARIN SODIUM 30 MG/0.3ML ~~LOC~~ SOLN: 30.0000 mg | Freq: Two times a day (BID) | SUBCUTANEOUS | Status: DC

## 2020-05-25 MED ORDER — BISACODYL 10 MG RE SUPP: 10.0000 mg | Freq: Every day | RECTAL | Status: DC | PRN

## 2020-05-25 MED ORDER — METHOCARBAMOL 500 MG PO TABS
1000.0000 mg | ORAL_TABLET | Freq: Three times a day (TID) | ORAL | Status: DC
  Administered 2020-05-25 – 2020-06-01 (×20): 1000 mg via ORAL
  Filled 2020-05-25 (×20): qty 2

## 2020-05-25 MED ORDER — PANTOPRAZOLE SODIUM 40 MG PO TBEC
40.0000 mg | DELAYED_RELEASE_TABLET | Freq: Every day | ORAL | Status: DC
  Administered 2020-05-26 – 2020-06-01 (×7): 40 mg via ORAL
  Filled 2020-05-25 (×7): qty 1

## 2020-05-25 MED ORDER — TRAMADOL HCL 50 MG PO TABS
100.0000 mg | ORAL_TABLET | Freq: Four times a day (QID) | ORAL | Status: DC
  Administered 2020-05-25 – 2020-06-01 (×27): 100 mg via ORAL
  Filled 2020-05-25 (×28): qty 2

## 2020-05-25 MED ORDER — SENNA 8.6 MG PO TABS
1.0000 | ORAL_TABLET | Freq: Two times a day (BID) | ORAL | Status: DC
  Administered 2020-05-25 – 2020-06-01 (×13): 8.6 mg via ORAL
  Filled 2020-05-25 (×13): qty 1

## 2020-05-25 MED ORDER — OXYCODONE HCL 5 MG PO TABS
10.0000 mg | ORAL_TABLET | ORAL | Status: DC | PRN
  Administered 2020-05-25 – 2020-05-28 (×13): 15 mg via ORAL
  Filled 2020-05-25 (×12): qty 3

## 2020-05-25 MED ORDER — BETHANECHOL CHLORIDE 10 MG PO TABS
10.0000 mg | ORAL_TABLET | Freq: Three times a day (TID) | ORAL | Status: DC
  Administered 2020-05-25 – 2020-05-26 (×2): 10 mg via ORAL
  Filled 2020-05-25 (×2): qty 1

## 2020-05-25 MED ORDER — ENOXAPARIN SODIUM 30 MG/0.3ML ~~LOC~~ SOLN
30.0000 mg | Freq: Two times a day (BID) | SUBCUTANEOUS | Status: DC
  Administered 2020-05-25 – 2020-06-01 (×14): 30 mg via SUBCUTANEOUS
  Filled 2020-05-25 (×14): qty 0.3

## 2020-05-25 MED ORDER — LIDOCAINE 5 % EX PTCH
1.0000 | MEDICATED_PATCH | CUTANEOUS | Status: DC
  Administered 2020-05-30: 1 via TRANSDERMAL
  Filled 2020-05-25 (×4): qty 1

## 2020-05-25 MED ORDER — GABAPENTIN 300 MG PO CAPS
600.0000 mg | ORAL_CAPSULE | Freq: Three times a day (TID) | ORAL | Status: DC
  Administered 2020-05-25 – 2020-05-30 (×16): 600 mg via ORAL
  Filled 2020-05-25 (×16): qty 2

## 2020-05-25 MED ORDER — ACETAMINOPHEN 325 MG PO TABS
325.0000 mg | ORAL_TABLET | ORAL | Status: DC | PRN
  Administered 2020-05-27 – 2020-05-31 (×5): 650 mg via ORAL
  Filled 2020-05-25 (×6): qty 2

## 2020-05-25 MED ORDER — POLYETHYLENE GLYCOL 3350 17 G PO PACK
17.0000 g | PACK | Freq: Two times a day (BID) | ORAL | Status: DC
  Administered 2020-05-25 – 2020-05-26 (×2): 17 g via ORAL
  Filled 2020-05-25 (×8): qty 1

## 2020-05-25 MED ORDER — DOCUSATE SODIUM 100 MG PO CAPS
100.0000 mg | ORAL_CAPSULE | Freq: Two times a day (BID) | ORAL | Status: DC
  Administered 2020-05-25 – 2020-06-01 (×14): 100 mg via ORAL
  Filled 2020-05-25 (×15): qty 1

## 2020-05-25 MED ORDER — TAMSULOSIN HCL 0.4 MG PO CAPS
0.4000 mg | ORAL_CAPSULE | Freq: Every day | ORAL | Status: DC
  Administered 2020-05-26 – 2020-05-31 (×6): 0.4 mg via ORAL
  Filled 2020-05-25 (×6): qty 1

## 2020-05-25 MED ORDER — ONDANSETRON 4 MG PO TBDP: 4.0000 mg | ORAL_TABLET | Freq: Four times a day (QID) | ORAL | Status: DC | PRN
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
## 2020-05-25 NOTE — Progress Notes (Signed)
Pt being discharged to CIR. Report was called to RN on 4W, room items packed, and PIV removed. Pt's only personal belongings are his cell phone and phone charger, which are both with him. Pt taken to 4W16 by this RN and left with 4W RN, call bell, and phone.   Robina Ade, RN
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
## 2020-05-25 NOTE — TOC Transition Note (Signed)
Transition of Care [facility]) - CM/SW Discharge Note   Patient Details  Name: Sigurd Pugh MRN: [id number] Date of Birth: 03/13/1996  Transition of Care [facility]) CM/SW Contact:  Glennon Mac, RN Phone Number: 05/25/2020, 2:40 PM   Clinical Narrative:  Pt medically stable for discharge, and has been accepted for admission to [facility] IP Rehab today.  Plan dc to CIR upon bed availability.     Final next level of care: IP Rehab Facility Barriers to Discharge: Barriers Resolved   Patient Goals and CMS Choice Patient states their goals for this hospitalization and ongoing recovery are:: to get rid of this pain CMS Medicare.gov Compare Post Acute Care list provided to:: Patient Choice offered to / list presented to : Patient                      Discharge Plan and Services   Discharge Planning Services: [facility] Program Post Acute Care Choice: IP Rehab                               Social Determinants of Health (SDOH) Interventions     Readmission Risk Interventions No flowsheet data found.  Quintella Baton, RN, BSN  Trauma/Neuro ICU Case Manager [phone]

## 2020-05-25 NOTE — Progress Notes (Signed)
Inpatient Rehabilitation  Patient information reviewed and entered into eRehab system by Melissa M. Bowie, M.A., CCC/SLP, PPS Coordinator.  Information including medical coding, functional ability and quality indicators will be reviewed and updated through discharge.    

## 2020-05-25 NOTE — Progress Notes (Signed)
Inpatient Rehabilitation Medication Review by a Pharmacist  A complete drug regimen review was completed for this patient to identify any potential clinically significant medication issues.  Clinically significant medication issues were identified:  no  Check AMION for pharmacist assigned to patient if future medication questions/issues arise during this admission.  Pharmacist comments:   Time spent performing this drug regimen review (minutes):  5   Emanuela Runnion A. Jeanella Craze, PharmD, BCPS, FNKF Clinical Pharmacist [place] Please utilize Amion for appropriate phone number to reach the unit pharmacist [facility])  05/25/2020 3:42 PM
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
## 2020-05-26 ENCOUNTER — Inpatient Hospital Stay (HOSPITAL_COMMUNITY): Payer: BC Managed Care – PPO

## 2020-05-26 DIAGNOSIS — S343XXD Injury of cauda equina, subsequent encounter: Secondary | ICD-10-CM

## 2020-05-26 DIAGNOSIS — R609 Edema, unspecified: Secondary | ICD-10-CM

## 2020-05-26 LAB — CBC WITH DIFFERENTIAL/PLATELET
Abs Immature Granulocytes: 0.1 10*3/uL — ABNORMAL HIGH (ref 0.00–0.07)
Basophils Absolute: 0.1 10*3/uL (ref 0.0–0.1)
Basophils Relative: 1 %
Eosinophils Absolute: 0.3 10*3/uL (ref 0.0–0.5)
Eosinophils Relative: 3 %
HCT: 38.3 % — ABNORMAL LOW (ref 39.0–52.0)
Hemoglobin: 12.4 g/dL — ABNORMAL LOW (ref 13.0–17.0)
Immature Granulocytes: 1 %
Lymphocytes Relative: 30 %
Lymphs Abs: 2.8 10*3/uL (ref 0.7–4.0)
MCH: 27.3 pg (ref 26.0–34.0)
MCHC: 32.4 g/dL (ref 30.0–36.0)
MCV: 84.4 fL (ref 80.0–100.0)
Monocytes Absolute: 0.8 10*3/uL (ref 0.1–1.0)
Monocytes Relative: 9 %
Neutro Abs: 5.2 10*3/uL (ref 1.7–7.7)
Neutrophils Relative %: 56 %
Platelets: 460 10*3/uL — ABNORMAL HIGH (ref 150–400)
RBC: 4.54 MIL/uL (ref 4.22–5.81)
RDW: 14.4 % (ref 11.5–15.5)
WBC: 9.2 10*3/uL (ref 4.0–10.5)
nRBC: 0 % (ref 0.0–0.2)

## 2020-05-26 LAB — COMPREHENSIVE METABOLIC PANEL
ALT: 70 U/L — ABNORMAL HIGH (ref 0–44)
AST: 34 U/L (ref 15–41)
Albumin: 3.3 g/dL — ABNORMAL LOW (ref 3.5–5.0)
Alkaline Phosphatase: 184 U/L — ABNORMAL HIGH (ref 38–126)
Anion gap: 10 (ref 5–15)
BUN: 16 mg/dL (ref 6–20)
CO2: 29 mmol/L (ref 22–32)
Calcium: 9.6 mg/dL (ref 8.9–10.3)
Chloride: 99 mmol/L (ref 98–111)
Creatinine, Ser: 0.77 mg/dL (ref 0.61–1.24)
GFR, Estimated: >60 mL/min (ref >60)
Glucose, Bld: 86 mg/dL (ref 70–99)
Potassium: 3.7 mmol/L (ref 3.5–5.1)
Sodium: 138 mmol/L (ref 135–145)
Total Bilirubin: 0.3 mg/dL (ref 0.3–1.2)
Total Protein: 6.9 g/dL (ref 6.5–8.1)

## 2020-05-26 MED ORDER — OXYCODONE HCL ER 10 MG PO T12A
10.0000 mg | EXTENDED_RELEASE_TABLET | Freq: Every day | ORAL | Status: DC
  Administered 2020-05-26: 10 mg via ORAL
  Filled 2020-05-26: qty 1
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
## 2020-05-26 NOTE — Progress Notes (Signed)
Inpatient Rehabilitation Center Individual Statement of Services  Patient Name:  Thomas Hodge  Date:  05/26/2020  Welcome to the Inpatient Rehabilitation Center.  Our goal is to provide you with an individualized program based on your diagnosis and situation, designed to meet your specific needs.  With this comprehensive rehabilitation program, you will be expected to participate in at least 3 hours of rehabilitation therapies Monday-Friday, with modified therapy programming on the weekends.  Your rehabilitation program will include the following services:  Physical Therapy (PT), Occupational Therapy (OT), Speech Therapy (ST), 24 hour per day rehabilitation nursing, Therapeutic Recreaction (TR), Neuropsychology, Care Coordinator, Rehabilitation Medicine, Nutrition Services, Pharmacy Services and Other  Weekly team conferences will be held on Wednesdays to discuss your progress.  Your Inpatient Rehabilitation Care Coordinator will talk with you frequently to get your input and to update you on team discussions.  Team conferences with you and your family in attendance may also be held.  Expected length of stay: 5-7 Days  Overall anticipated outcome: MOD I to Supervision  Depending on your progress and recovery, your program may change. Your Inpatient Rehabilitation Care Coordinator will coordinate services and will keep you informed of any changes. Your Inpatient Rehabilitation Care Coordinator's name and contact numbers are listed  below.  The following services may also be recommended but are not provided by the Inpatient Rehabilitation Center:    Home Health Rehabiltiation Services  Outpatient Rehabilitation Services    Arrangements will be made to provide these services after discharge if needed.  Arrangements include referral to agencies that provide these services.  Your insurance has been verified to be:  Uninsured Your primary doctor is:  NO PCP  Pertinent information will  be shared with your doctor and your insurance company.  Inpatient Rehabilitation Care Coordinator:  Lavera Guise, [place] [phone] or [phone]  Information discussed with and copy given to patient by: [name], 05/26/2020, 11:51 AM

## 2020-05-26 NOTE — Plan of Care (Signed)
  Problem: RH Balance Goal: LTG Patient will maintain dynamic standing with ADLs (OT) Description: LTG:  Patient will maintain dynamic standing balance with assist during activities of daily living (OT)  Flowsheets (Taken 05/26/2020 1246) LTG: Pt will maintain dynamic standing balance during ADLs with: Supervision/Verbal cueing   Problem: Sit to Stand Goal: LTG:  Patient will perform sit to stand in prep for activites of daily living with assistance level (OT) Description: LTG:  Patient will perform sit to stand in prep for activites of daily living with assistance level (OT) Flowsheets (Taken 05/26/2020 1246) LTG: PT will perform sit to stand in prep for activites of daily living with assistance level: Supervision/Verbal cueing   Problem: RH Grooming Goal: LTG Patient will perform grooming w/assist,cues/equip (OT) Description: LTG: Patient will perform grooming with assist, with/without cues using equipment (OT) Flowsheets (Taken 05/26/2020 1246) LTG: Pt will perform grooming with assistance level of: Set up assist    Problem: RH Bathing Goal: LTG Patient will bathe all body parts with assist levels (OT) Description: LTG: Patient will bathe all body parts with assist levels (OT) Flowsheets (Taken 05/26/2020 1246) LTG: Pt will perform bathing with assistance level/cueing: Supervision/Verbal cueing   Problem: RH Dressing Goal: LTG Patient will perform upper body dressing (OT) Description: LTG Patient will perform upper body dressing with assist, with/without cues (OT). Flowsheets (Taken 05/26/2020 1246) LTG: Pt will perform upper body dressing with assistance level of: Supervision/Verbal cueing Goal: LTG Patient will perform lower body dressing w/assist (OT) Description: LTG: Patient will perform lower body dressing with assist, with/without cues in positioning using equipment (OT) Flowsheets (Taken 05/26/2020 1246) LTG: Pt will perform lower body dressing with assistance level of:  (excluding TEDS) Supervision/Verbal cueing   Problem: RH Toileting Goal: LTG Patient will perform toileting task (3/3 steps) with assistance level (OT) Description: LTG: Patient will perform toileting task (3/3 steps) with assistance level (OT)  Flowsheets (Taken 05/26/2020 1246) LTG: Pt will perform toileting task (3/3 steps) with assistance level: Supervision/Verbal cueing   Problem: RH Toilet Transfers Goal: LTG Patient will perform toilet transfers w/assist (OT) Description: LTG: Patient will perform toilet transfers with assist, with/without cues using equipment (OT) Flowsheets (Taken 05/26/2020 1246) LTG: Pt will perform toilet transfers with assistance level of: Supervision/Verbal cueing   Problem: RH Tub/Shower Transfers Goal: LTG Patient will perform tub/shower transfers w/assist (OT) Description: LTG: Patient will perform tub/shower transfers with assist, with/without cues using equipment (OT) Flowsheets (Taken 05/26/2020 1246) LTG: Pt will perform tub/shower stall transfers with assistance level of: Supervision/Verbal cueing
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
## 2020-05-26 NOTE — Progress Notes (Signed)
Patient ID: Thomas Hodge, male   DOB: 1996/01/13, 25 y.o.   MRN: [id number] Team Conference Report to Patient/Family  Team Conference discussion was reviewed with the patient and caregiver, including goals, any changes in plan of care and target discharge date.  Patient and caregiver express understanding and are in agreement.  The patient has a target discharge date of  (Initial evals pending).  [name] 05/26/2020, 1:27 PM

## 2020-05-26 NOTE — CV Procedure (Signed)
BLE venous duplex completed.  Results can be found under chart review under CV PROC. 05/26/2020 12:06 PM Synia Douglass RVT, RDMS
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
## 2020-05-26 NOTE — Progress Notes (Addendum)
PROGRESS NOTE   Subjective/Complaints:  Pt c/o pain at night in low back and pelvic area Per RN, cont of bowel and bladder , not requiring caths  ROS- neg CP, SOB, N/V/D Objective:   No results found. Recent Labs    05/25/20 1546 05/26/20 0610  WBC 10.0 9.2  HGB 11.2* 12.4*  HCT 34.3* 38.3*  PLT 473* 460*   Recent Labs    05/25/20 1546 05/26/20 0610  NA  --  138  K  --  3.7  CL  --  99  CO2  --  29  GLUCOSE  --  86  BUN  --  16  CREATININE 0.73 0.77  CALCIUM  --  9.6    Intake/Output Summary (Last 24 hours) at 05/26/2020 0856 Last data filed at 05/26/2020 0438 Gross per 24 hour  Intake --  Output 700 ml  Net -700 ml        Physical Exam: Vital Signs Blood pressure 122/77, pulse 61, temperature 98 F (36.7 C), resp. rate 17, height 5\' 9"  (1.753 m), weight 61.8 kg, SpO2 98 %.   General: No acute distress Mood and affect are appropriate Heart: Regular rate and rhythm no rubs murmurs or extra sounds Lungs: Clear to auscultation, breathing unlabored, no rales or wheezes Abdomen: Positive bowel sounds, soft nontender to palpation, nondistended Extremities: No clubbing, cyanosis, or edema Skin: No evidence of breakdown, no evidence of rash Neurologic: Cranial nerves II through XII intact, motor strength is 5/5 in bilateral deltoid, bicep, tricep, grip,4/5 RIght and 2- left  hip flexor, knee extensors, ankle dorsiflexor and plantar flexor Sensory exam normal sensation to light touch in bilateral upper and lower extremities with exception of RIgh tR L1 dermatot   Musculoskeletal: Full range of motion in all 4 extremities. No joint swelling   Assessment/Plan: 1. Functional deficits which require 3+ hours per day of interdisciplinary therapy in a comprehensive inpatient rehab setting.  Physiatrist is providing close team supervision and 24 hour management of active medical problems listed  below.  Physiatrist and rehab team continue to assess barriers to discharge/monitor patient progress toward functional and medical goals  Care Tool:  Bathing              Bathing assist       Upper Body Dressing/Undressing Upper body dressing   What is the patient wearing?: Hospital gown only    Upper body assist Assist Level: Set up assist    Lower Body Dressing/Undressing Lower body dressing            Lower body assist       Toileting Toileting    Toileting assist Assist for toileting: Minimal Assistance - Patient > 75%     Transfers Chair/bed transfer  Transfers assist           Locomotion Ambulation   Ambulation assist              Walk 10 feet activity   Assist           Walk 50 feet activity   Assist           Walk 150 feet activity   Assist  Walk 10 feet on uneven surface  activity   Assist           Wheelchair     Assist               Wheelchair 50 feet with 2 turns activity    Assist            Wheelchair 150 feet activity     Assist          Blood pressure 122/77, pulse 61, temperature 98 F (36.7 C), resp. rate 17, height 5\' 9"  (1.753 m), weight 61.8 kg, SpO2 98 %.   Medical Problem List and Plan: 1.  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  Back brace when out of bed             -patient may  Shower if can cover back brace?             -ELOS/Goals: 5-7 days- mod I 2.  Antithrombotics: -DVT/anticoagulation: Lovenox.  Check vascular study             -antiplatelet therapy: N/A 3. Pain Management: Tramadol 100 mg every 6 hours, Neurontin 600 mg 3 times daily, Lidoderm patch as directed, Robaxin 1000 mg every 8 hours, oxycodone as needed 4. Mood: Provide emotional  support             -antipsychotic agents: N/A 5. Neuropsych: This patient is capable of making decisions on his own behalf. 6. Skin/Wound Care: Routine skin checks 7. Fluids/Electrolytes/Nutrition: Routine in and outs with follow-up chemistries 8.  Acute blood loss anemia.  Follow-up CBC 9.  Neurogenic bowel and bladder.  Urecholine 10 mg 3 times daily, Colace 100 mg twice daily, MiraLAX twice daily -also added Flomax 0.4 mg nightly since still having difficulty starting his stream- U/A (-) today.  10.  History of tobacco and polysubstance use.  Provide counseling    LOS: 1 days A FACE TO FACE EVALUATION WAS PERFORMED  07/13/2020 05/26/2020, 8:56 AM

## 2020-05-27 MED ORDER — OXYCODONE HCL ER 15 MG PO T12A
15.0000 mg | EXTENDED_RELEASE_TABLET | Freq: Every day | ORAL | Status: DC
  Administered 2020-05-27 – 2020-05-30 (×4): 15 mg via ORAL
  Filled 2020-05-27 (×4): qty 1

## 2020-05-27 NOTE — Progress Notes (Addendum)
Found vape laying in pt bed. Educated pt on smoking and its not allowed in hospital. Pt in agreement. Belongings gathered in enveloped and given to ADON for proper storage. Mylo Red, LPN

## 2020-05-27 NOTE — Progress Notes (Signed)
Occupational Therapy Session Note  Patient Details  Name: Thomas Hodge MRN: [id number] Date of Birth: 07/07/1995  Today's Date: 05/27/2020 OT Individual Time: 8110-3159 OT Individual Time Calculation (min): 58 min    Short Term Goals: Week 1:  OT Short Term Goal 1 (Week 1): STGs = LTGs d/t ELOS at Supervision  Skilled Therapeutic Interventions/Progress Updates:     Pt received semi-reclined in bed, agreeable to therapy. Supine > sitting EOB via log roll with close S. STS and amb to shower with RW + close S + min VCs for L foot placement/clearance, shower transfer with towels on chair for comfort with CGA. Bathed full-body with min A for lower BLE/buttocks. Will benefit from [facility] sponge/reacher/further AE edu when available in unit. Reviewed back precautions. Transferred back to bed same manner as before. Doffed/donned shirt close S, donned TLSO with min A to adjust straps, doffed close S. Doffed pants with close S, donned pants with mod A to thread BLE. Stood at sink with close S to brush teeth. Self-propelled w/c with close S to therapy bathroom and completed 1 TTB transfer with CGA after visual demonstration. Req to be pushed back in w/c 2/2 fatigue. Ambulatory transfer back to bed same manner as before. C/o of mild stomach pain 2/2 eating takis, not requesting rx at this time. Pt left semi-reclined in bed with bed alarm engaged, call bell in reach, environmental services present, and all immediate needs met.    Therapy Documentation Precautions:  Precautions Precautions: Fall,Back Precaution Comments: reviewed back precautions, able to recall 2/3 Required Braces or Orthoses: Spinal Brace Spinal Brace: Thoracolumbosacral orthotic,Applied in sitting position Restrictions Weight Bearing Restrictions: No Pain: Pain Assessment Pain Score: Asleep ADL: See Care Tool for more details.   Therapy/Group: Individual Therapy  Volanda Napoleon MS, OTR/L  05/27/2020, 6:46 AM

## 2020-05-27 NOTE — Progress Notes (Signed)
Physical Therapy Session Note  Patient Details  Name: Thomas Hodge MRN: [id number] Date of Birth: 07/21/1995  Today's Date: 05/27/2020 PT Individual Time: 1000-1100 PT Individual Time Calculation (min): 60 min   Short Term Goals: Week 1:  PT Short Term Goal 1 (Week 1): STG =LTG due to ELOS  Skilled Therapeutic Interventions/Progress Updates:    Patient supine in bed upon PT arrival. Patient alert and agreeable to PT session. Patient states low grade pain at location of low back pain during session.  Therapeutic Activity: Bed Mobility: Patient performed supine <> sit with CGA. Pt requires vc and CGA for bringing LLE to EOB. VC for abdominal bracing and BUE push to bring UB to seated position at EOB. TLSO donned with Mod A.  Transfers: Patient performed squat pivot transfer bed --> w/c with supervision. Provided verbal cues for LLE positioning. STS throughout to RW with close supervision. SPVT performed with CGA d/t lack of L foot clearance during pivot stepping.   Gait Training:  Ace wrap applied to pt's LLE for L dorsiflexion assist. Patient ambulated 50 feet including 180 degree turn using RW with CGA to L knee for block to prevent potential buckle. No buckle noted. Vc throughout for strong extension during each stance phase to LLE. Pt is pleased with wrap to LLE to assist with foot drop. Ambulates additional 150 feet using RW and CGA. Breakdown in quality of gait and LLE strength noted around 100 feet. Pt reminded to sit in w/c with onset of fatigue in LLE. Pt pushes himself to continue to 150 and PT stops amb bout. Education provided to pt re: fatigue of LLE and increased use of RLE and BUE in order to compensate for decreased LLE strength. Point of therapy is to strengthen LLE while pt is here. Pt appreciative of information and demos understanding.   Pt states that he would like to attempt stairs with wrap on LLE as he believes his performance will improve without continuously  catching his toe on step. Pt ambulates to steps. L knee buckle with lift of RLE to first step with PT and pt able to correct. Pt is able to complete 4 steps with use of RHR and vc for holding strong knee extension on LLE with each step with RLE. Min A and CGA for extending LLE out to each step in descent.   Neuromuscular Re-ed: NMR facilitated during session with focus on trunk and LLE strengthening and motor control. Pt guided in standing toe touches 2x20 bilaterally, minisquats 2x10 using mirror for midline orientation for equal BLE activation. CGA provided to L hip and knee musculature for improved facilitation. NMR performed for improvements in motor control and coordination, balance, sequencing, judgement, and self confidence/ efficacy in performing all aspects of mobility at highest level of independence.   Patient supine in bed at end of session with brakes locked, bed alarm set, and all needs within reach.    Therapy Documentation Precautions:  Precautions Precautions: Fall,Back Precaution Comments: reviewed back precautions, able to recall 2/3 Required Braces or Orthoses: Spinal Brace Spinal Brace: Thoracolumbosacral orthotic,Applied in sitting position Restrictions Weight Bearing Restrictions: No  Therapy/Group: Individual Therapy  Loel Dubonnet 05/27/2020, 7:08 PM
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
## 2020-05-27 NOTE — Progress Notes (Signed)
Physical Therapy Session Note  Patient Details  Name: Thomas Hodge MRN: [id number] Date of Birth: November 17, 1995  Today's Date: 05/27/2020 PT Individual Time: 1300-1415 PT Individual Time Calculation (min): 75 min   Short Term Goals: Week 1:  PT Short Term Goal 1 (Week 1): STG =LTG due to ELOS  Skilled Therapeutic Interventions/Progress Updates:   Pt received supine in bed and agreeable to PT. Supine>sit transfer with min assist at the LLE with min cues for safety awareness. Pt ableto don TLSO sitting EOB with min cues for positioning of brace and parts management.   Stand pivot transfer to [facility] with RW and CGA. All additional sit<>stand and stand pivot transfer to and from [facility] with supervision assist for safety with min cues for improved terminal knee extension in stance on the LLE.    WC mobility  through hall and hospital gift shop x 261f, 3049fand 22048fith min cues for improved obstacle navigation to weave through tight spaces of gift shop.    Supine and side lying therex with emphasis on LLE.  SAQ, x 12 BLE pt reports pain in the mid thoracic region in supine. Able to continue in sidelying.  Clam shell 2 x 6.  Hip abduction 2 x 10  Hip/knee flexion/extension on powder board 2 x 10  Knee flexion/extension through full range. 2 x 12  AAROM for hip extension and knee flexion throughout sidelying therex with min cues for increased hold at end rage as tolerated.   Gait training in rehab gym x [id number] through hospital gift shop x [id number] RW and CGA from PT as well as DF ankle wrap to the LLE.  Cues for improved terminal knee extension intermittently with only 2 episodes of buckling, which pt self corrected through UE support on RW.   Pt returned to room and performed stand pivot transfer to bed with RW and supervision assist. Sit>supine completed with min assist for LLE management. Pt left supine in bed with call bell in reach and all needs met.       Therapy  Documentation Precautions:  Precautions Precautions: Fall,Back Precaution Comments: reviewed back precautions, able to recall 2/3 Required Braces or Orthoses: Spinal Brace Spinal Brace: Thoracolumbosacral orthotic,Applied in sitting position Restrictions Weight Bearing Restrictions: No    Vital Signs: Therapy Vitals Temp: 98.8 F (37.1 C) Temp Source: Oral Pulse Rate: 84 Resp: 18 BP: 117/74 Patient Position (if appropriate): Lying Oxygen Therapy SpO2: 100 % O2 Device: Room Air Pain: Denies at rest    Therapy/Group: Individual Therapy  AustiLorie Phenix/2022, 4:44 PM
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
## 2020-05-27 NOTE — Progress Notes (Signed)
Physical Therapy Session Note  Patient Details  Name: Thomas Hodge MRN: [id number] Date of Birth: Mar 21, 1996  Today's Date: 05/26/2020 PT Individual Time:  1535-1630   60mn  Short Term Goals: Week 1:  PT Short Term Goal 1 (Week 1): STG =LTG due to ELOS  Skilled Therapeutic Interventions/Progress Updates:   Pt received supine in bed and agreeable to PT.   Supine>sit transfer without assist from PT. PT obtained 16x18 WC and raised to standard height while pt performed sitting balance EOB with supervision assist from PT with 1-2 Ue support     [facility] improved pelvic rotation x 2 throughout session. Sit<>stand fom WC with CGA from PT and BUE support on RW. Pt then performed car transfer with RW and min assist for safety and improved technique to position hips on car seat.   WC mobility 2 x 2284fwith distant supervision assist from PT and min cues for safety through narrow door at orthogym.  Kinetron BLE reciprocal strengthening/ muscle endurance training 3 x 1.4m26mwith proloned therapeutic rest break between bouts.   Pt returned to room and performed squat pivot transfer to bed with min assist. Sit>supine completed with supervsion assist, and left supine in bed with call bell in reach and all needs met.    Therapy Documentation Precautions:  Precautions Precautions: Fall,Back Precaution Comments: reviewed back precautions, able to recall 2/3 Required Braces or Orthoses: Spinal Brace Spinal Brace: Thoracolumbosacral orthotic,Applied in sitting position Restrictions Weight Bearing Restrictions: No    Vital Signs: Therapy Vitals Temp: 98 F (36.7 C) Pulse Rate: 67 Resp: 16 BP: 115/74 Patient Position (if appropriate): Lying Oxygen Therapy SpO2: 99 % Pain: Pain Assessment Pain Scale: 0-10 Pain Score: 9  Pain Type: Acute pain;Surgical pain Pain Location: Back Pain Intervention(s): Medication (See  eMAR)    Therapy/Group: Individual Therapy  AusLorie Phenix24/2022, 8:01 AM

## 2020-05-28 MED ORDER — OXYCODONE HCL 5 MG PO TABS
5.0000 mg | ORAL_TABLET | Freq: Four times a day (QID) | ORAL | Status: DC | PRN
  Administered 2020-05-28 – 2020-05-30 (×8): 10 mg via ORAL
  Filled 2020-05-28 (×8): qty 2

## 2020-05-28 NOTE — Progress Notes (Signed)
Occupational Therapy Session Note  Patient Details  Name: Thomas Hodge MRN: [id number] Date of Birth: 10/19/1995  Today's Date: 05/28/2020 OT Individual Time: 0947-1056 OT Individual Time Calculation (min): 69 min    Short Term Goals: Week 1:  OT Short Term Goal 1 (Week 1): STGs = LTGs d/t ELOS at Supervision  Skilled Therapeutic Interventions/Progress Updates:    Pt received asleep in bed, awaked with mod VCs, agreeable to therapy. Supine > sitting EOB with close S + use of bed rail. STS and amb to shower with RW + close S, shower transfer CGA. Bathed full-body with min A to bathe feet/buttocks. Doffed/donned shirt distant S. Donned TLSO with min A to adjust straps, doffed close S. Donned pants with mod A to thread BLE. No AE for LB dressing available to provide this date. Donned TEDS/B socks with total A. Req to be pushed in w/c this date 2/2 fatigue. With demo reacher in gy, threaded theraband loop around BLE and additionally donned/doffed B shocks with reacher/sock aide after visual demonstration for improved LBD performance. Toe stepping activity alternating BLE in standing in prep for improved activity tolerance, dynamic standing balance, and LLE strength for improved ADL performance. Req overall CGA but with 1 posterior LOB onto mat when attempting STS without AD. Req AD throughout activity. Used reacher to placed clothes into laundry. Squat-pivot from w/c with close S back to bed. Sitting EOB > supine with close S. Good adherence to back precautions throughout session.  Pt left supine in bed with bed alarm engaged, call bell in reach, and all immediate needs met.    Therapy Documentation Precautions:  Precautions Precautions: Fall,Back Precaution Comments: reviewed back precautions, able to recall 2/3 Required Braces or Orthoses: Spinal Brace Spinal Brace: Thoracolumbosacral orthotic,Applied in sitting position Restrictions Weight Bearing Restrictions: No Pain: Pain  Assessment Pain Scale: 0-10 Pain Score: 6  Pain Type: Surgical pain Pain Location: Back Pain Intervention(s): Medication (See eMAR) ADL:See Care Tool for more details.   Therapy/Group: Individual Therapy   A  MS, OTR/L  05/28/2020, 12:42 PM  

## 2020-05-28 NOTE — Progress Notes (Signed)
Physical Therapy Session Note  Patient Details  Name: Thomas Hodge MRN: [id number] Date of Birth: 30-Sep-1995  Today's Date: 05/28/2020 PT Individual Time: 0805-0850 PT Individual Time Calculation (min): 45 min   Short Term Goals: Week 1:  PT Short Term Goal 1 (Week 1): STG =LTG due to ELOS  Skilled Therapeutic Interventions/Progress Updates:   Pt received supine in bed and agreeable to PT. Supine>sit transfer with supervision assist and cues for attention to the LLE to avoid getting foot stuck on sheets.     BITS  Visual scanning: seated 51 Standing 50 Visual pursuits rotation/sequence 1-20:  Seated 28 sec Standing 22 sec Rotation/sequecne number/letter 1-J:  Standing: 54 sec Cues for decreased trunk rotation and improved WB through the LLE as tolerated to force muscle activation in standing.     WC mobility through hall x 139f, 1228f and [id number] distant supervision assist with only min cues for doorway management occasionally.    Pt reports need to urinate. Toileting attempted from standing, but unable, so pt transferred to sitting and able to clear bladder. Distant supervision assist from PT from with cues for use of rail for support while performing hygiene. Stand pivot transfer completed with RW and supervision assist.    Pt then reports increasing back pain to 9/10 and requests to return to bed. Pt returned to room and performed squat pivot transfer to bed with supervision assist. Able to doff back brace with supervision assist, Sit>supine completed with min assist at the LLE, and left supine in bed with call bell in reach and all needs met.         Therapy Documentation Precautions:  Precautions Precautions: Fall,Back Precaution Comments: reviewed back precautions, able to recall 2/3 Required Braces or Orthoses: Spinal Brace Spinal Brace: Thoracolumbosacral orthotic,Applied in sitting position Restrictions Weight Bearing Restrictions: No General: PT  Amount of Missed Time (min): 15 Minutes Vital Signs: Therapy Vitals Temp: 98.2 F (36.8 C) Temp Source: Oral Pulse Rate: 79 Resp: 18 BP: 123/71 Patient Position (if appropriate): Lying Oxygen Therapy SpO2: 96 % O2 Device: Room Air Pain: Pain Assessment Pain Scale: 0-10 Pain Score: 5  Pain Type: Surgical pain Pain Location: Back Pain Intervention(s): Medication (See eMAR)    Therapy/Group: Individual Therapy  AusLorie Phenix25/2022, 9:01 AM

## 2020-05-28 NOTE — Progress Notes (Signed)
Occupational Therapy Session Note  Patient Details  Name: Thomas Hodge MRN: [id number] Date of Birth: [birth year]  Today's Date: 05/28/2020 OT Individual Time: 1400-1430 OT Individual Time Calculation (min): 30 min    Short Term Goals: Week 1:  OT Short Term Goal 1 (Week 1): STGs = LTGs d/t ELOS at Supervision  Skilled Therapeutic Interventions/Progress Updates:    Pt received in bed agreeabl eto OT. Pt dons brace with Vc for putting straps underneath armpits. Pt provided with reacher and reacher bag for on RW. Clothes not finished out of dryer till end of session but pt educated on using reacher to gether items from lower height to maintain precautions. Pt completes 2x10-15 dowel rod therex for BUE shoulder strengthening required for BADLs/functional transfers as follows with demo cuing and 2 # dowel rod  Shoulder flex/ext, shoulder press, horizonal ab/adduct Circles in B directions Elbow flex/ext, chest press Wrist flex/ext  Repostioned at end of session in bed for back pain relief. Exited session with pt seated in bed, exit alarm on and call light in reach    Therapy Documentation Precautions:  Precautions Precautions: Fall,Back Precaution Comments: reviewed back precautions, able to recall 2/3 Required Braces or Orthoses: Spinal Brace Spinal Brace: Thoracolumbosacral orthotic,Applied in sitting position Restrictions Weight Bearing Restrictions: No General:   Vital Signs: Therapy Vitals Temp: 98.2 F (36.8 C) Temp Source: Oral Pulse Rate: 79 Resp: 18 BP: 123/71 Patient Position (if appropriate): Lying Oxygen Therapy SpO2: 96 % O2 Device: Room Air Pain: Pain Assessment Pain Scale: 0-10 Pain Score: 9  Pain Type: Surgical pain Pain Location: Back Pain Intervention(s): Medication (See eMAR) ADL: ADL Eating: Independent Grooming: Setup Where Assessed-Grooming: Edge of bed Upper Body Bathing: Supervision/safety Where Assessed-Upper Body Bathing:  Shower Lower Body Bathing: Moderate assistance Where Assessed-Lower Body Bathing: Shower Upper Body Dressing: Moderate assistance Where Assessed-Upper Body Dressing: Edge of bed Lower Body Dressing: Moderate assistance Where Assessed-Lower Body Dressing: Edge of bed Toileting: Minimal assistance Toilet Transfer: Minimal assistance Tub/Shower Transfer: Minimal assistance Film/video editor: Minimal assistance Film/video editor Method: Ambulating Vision   Perception    Praxis   Exercises:   Other Treatments:     Therapy/Group: Individual Therapy  Shon Hale 05/28/2020, 6:45 AM
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
## 2020-05-29 NOTE — Progress Notes (Signed)
Pt girlfriend found without mask on in room and eating x2. Charge nurse Dauda notified. Mylo Red, LPN

## 2020-05-29 NOTE — Progress Notes (Signed)
Occupational Therapy Session Note  Patient Details  Name: Thomas Hodge MRN: [id number] Date of Birth: [birth year]  Today's Date: 05/29/2020 OT Individual Time: 8786-7672 OT Individual Time Calculation (min): 63 min and 31 min   Short Term Goals: Week 1:  OT Short Term Goal 1 (Week 1): STGs = LTGs d/t ELOS at Supervision  Skilled Therapeutic Interventions/Progress Updates:    Session 1 [id number]): Pt received seated up in bed, agreeable to therapy. Readministered DC reassessments as documented in DC Note in anticipation of early DC home. Session focus on fam ed with girlfriend re shower/shower transfer, dressing, AE/DME recs, and TTB transfer. Supine > sitting EOB with close S and use of bed rail. STS and amb to shower to complete shower transfer with CGA and RW. Doffed/donned shirt close S. Doffed pants with min A to remove BLE. Donned pants with min A from GF to thread BLE with use of reacher. Doffed TEDS mod A to remove from B feet. Donned/doffed TLSO with min A to adjust straps. Donned B socks with sock aide and min A to adjust over feet. Will cont to benefit from more practice with AE for LBD. Transport to therapy bathroom, completed 1 TTB transfer with min A from GF to assist LLE in/out of tub. Gf with questions regarding DME recs, discussed rec equipment (will most likely only need to purchase LH sponge separately if early DC home as none available in dept this date. Stand-pivot back to bed with RW and close S. Sitting EOB > supine with mod A to bring BLE onto bed. GF with no further questions for OT at this time, feels comfortable providing care as she has CNA experience. Pt c/o of back pain 2/2 TLSO at end of session,"feels like a staple is stabbing me," MD made aware.  Pt left semi-reclined in bed with gf present with bed alarm engaged, call bell in reach, and all immediate needs met.    Session 2 ([id number]): Pt received semi-reclined in bed, agreeable to therapy. Denies pain  throughout session. Session focus on BUE/BLE strength in prep for improved ADL/func mobility performance. Supine > siting EOB with use of bed rail and close S, donned TLSO with min A to adjust straps. Stand-pivot to w/c with close S. Self-propelled w/c to gym distant S. Completed ~4 min on 30 level resistance on dynatron, with min freq rest breaks req every min. Amb to mat with RW + close S. Completed 2x15 of the following with 4 lb dumb bells: shoulder flexion and hold, bicep curls, punches, shoulder horizontal abduction with forward/backward circles. Improved performance from yesterday where pain was a limiting factor. Finally, completed 20 LLE raises in supine with 5 ankle weight. Amb back to room and complete bed mobility with close S to CGA with RW. Additionally able to achieve figure four position to adjust B shoes this date. Reports improved performance 2/2 removal of stitches earlier this pm. Pt left semi-reclined in bed with bed alarm engaged, call bell in reach, and all immediate needs met.    Therapy Documentation Precautions:  Precautions Precautions: Fall,Back Precaution Comments: reviewed back precautions, able to recall 2/3 Required Braces or Orthoses: Spinal Brace Spinal Brace: Thoracolumbosacral orthotic,Applied in sitting position Restrictions Weight Bearing Restrictions: No Pain: Pain Assessment Pain Scale: 0-10 Pain Score: 8  Faces Pain Scale: Hurts a little bit Pain Type: Surgical pain Pain Location: Back Pain Orientation: Lower Pain Intervention(s): Medication (See eMAR) ADL: See Care Tool for more details.  Therapy/Group: Individual Therapy  Volanda Napoleon MS, OTR/L  05/29/2020, 2:44 PM

## 2020-05-29 NOTE — Progress Notes (Signed)
Physical Therapy Session Note  Patient Details  Name: Thomas Hodge MRN: [id number] Date of Birth: [birth year]  Today's Date: 05/29/2020 PT Individual Time: 0901-1000 and 1100-1131 PT Individual Time Calculation (min): 59 min and 31 min  Short Term Goals: Week 1:  PT Short Term Goal 1 (Week 1): STG =LTG due to ELOS  Skilled Therapeutic Interventions/Progress Updates:  Session 1: Patient seated on EOB upon PT arrival to room. RN and pt's girlfriend present with RN providing morning medications. Patient alert and agreeable to PT session. Patient states 7/ 10 pain at back at start of session which is why RN was present to provide medication. Pt states that MD has okay'd removal of upper staples from pt's surgical scar as they are tight and pulling, causing pain for pt.    Therapeutic Activity: Bed Mobility: Patient performed supine --. sit with supervision requiring vc for bringing LLE to EOB prior to pushing UB up to seated position. With return to supine, pt continues to struggle to lift BLE from floor to bed surface. Pt reminded re: abdominal bracing to be performed prior to lowering UB to bed surface in order to keep body aligned and bring BLE to bed surface without twisting or bending  spine. Transfers: Patient performed STS throughout session with supervision. Pt continues to reach back for chair with UE and lower self using BUE without flexing hips. Pt reminded that his movement precautions are for his back, but not for his hips. Flex forward at hips but not at low back. Instructed pt and gf in technique of car transfer with car set at low car height. Pt attempts to enter with lift of LLE. CGA and vc for completing turn using RW until back of LE hit the car and then reaching back for seat and/ or dashboard for UE support. Requires extra effort for lift of LLE into car. Improved technique employed for exiting car with supervision and push from seat for rise to stand. GF in appropriate  position and reminded only to assist physically if pt relates that he requires help.   Gait Training:  Donned ace wrap to LLE for dorsiflexion assist. Patient ambulated 80 feet using RW with CGA/ supervision with +2 for w/c follow. Initial demonstration for gf of hand placement and not to tug on pt. Related to remind pt to hold L knee strong before stepping through with RLE. Gf positions correctly and maintains hand placement on pt's belt.   Provided verbal cues for L knee extension throughout.  Guided pt and gf in step negotiation. Quizzed pt on proper technique for ascending steps. He is able to relate correctly. On initiating ascent of first step, pt raises LLE. This PT corrects pt and reminds him to ascend with strong LE first. Positioned gf to pt's L side remaining one step below with L knee bent behind pt to provide "seat" for him in case of knee buckling. Also guided gf in placement of L hand above knee as guard to knee buckling. Pt is able to ascend steps with RLE leading in ascent and LLE leading in descent. GF in front of pt and ensuring L foot clearance of each step prior to full WB.   Wheelchair Mobility:  Patient propelled wheelchair >300 feet using BUE with supervision/ Mod I. No vc required for mainly straight path. Pt wheeled chair through 6 cones set at 5 foot intervals forward x2 and bkwd x2 only touching one cone throughout.   Patient supine in bed at end  of session with brakes locked, bed alarm set, and all needs within reach. GF in room.   Session 2:  Patient supine in bed  upon PT arrival. Patient alert and agreeable to PT session. Patient denied pain during session. Dons TLSO brace with gf's min A.   Therapeutic Activity: Bed Mobility: Patient performed supine --> sit with supervision. Sit-->supine with Min A for BLE to bed surface. Continued education required for abdominal bracing required prior to lowering UB to bed surface to move UB and LE together in one unit. Transfers:  Patient performed STS, SPVT, and toilet transfers throughout entire session with supervision/ CGA with fatigue. Provided vc for hip flexion to improve lean and weight shift in rising to stand and controlling descent to sit.   Gait Training:  Ace wrap donned to LLE for dorsiflexion assist prior to ambulation this session. Patient ambulated throughout hospital gift shop using RW and CGA. Ambulated with increased time to RLE. Provided verbal cues for equal time in stance and WB to midline with upright posture. 15 minutes spent ambulating with several standing rest breaks in gift shop. Pt states was not tired, but did end amb bout d/t need to urinate.   Wheelchair Mobility:  Patient propelled wheelchair 150 feet, then this PT pushes w/c for time the rest of the distance. To the gift shop.. Provided verbal cues for upright posture, increasing step height/ length.   Pt's gf reminded to drop off tennis shoes for pt at some point tomorrow so that AFO can be fit and ordered prior to pt's d/c from hospital next Tuesday or Wednesday.   Patient supine in bed at end of session with brakes locked, bed alarm set, and all needs within reach.    Therapy Documentation Precautions:  Precautions Precautions: Fall,Back Precaution Comments: reviewed back precautions, able to recall 2/3 Required Braces or Orthoses: Spinal Brace Spinal Brace: Thoracolumbosacral orthotic,Applied in sitting position Restrictions Weight Bearing Restrictions: No  Therapy/Group: Individual Therapy  Loel Dubonnet 05/29/2020, 12:10 PM

## 2020-05-29 NOTE — Progress Notes (Signed)
Physical Therapy Session Note  Patient Details  Name: Thomas Hodge MRN: [id number] Date of Birth: 06-16-1995   Today's Date: 05/29/2020 PT Individual Time:   1435-1505 30 min   Short Term Goals: Week 1:  PT Short Term Goal 1 (Week 1): STG =LTG due to ELOS  Skilled Therapeutic Interventions/Progress Updates:   Pt received supine in bed and agreeable to PT at bed level. Pt performed SAQ x 10 BLE, hip abduction x 10 BLE hip adduction x 10 BLE, ankle DF x 10 BLE with AAROM on the LLE with no active movement noted. Pt with questions about d/c date and needs. PT educated pt on need for family education and AFO for LLE. PT informed pt that medical team would confer and set d/c date for some time next week. Pt left in bed with call bell in reach and all needs met.      Therapy Documentation Precautions:  Precautions Precautions: Fall,Back Precaution Comments: reviewed back precautions, able to recall 2/3 Required Braces or Orthoses: Spinal Brace Spinal Brace: Thoracolumbosacral orthotic,Applied in sitting position Restrictions Weight Bearing Restrictions: No    Vital Signs: Therapy Vitals Temp: 98.1 F (36.7 C) Temp Source: Oral Pulse Rate: 79 Resp: 20 BP: 118/76 Patient Position (if appropriate): Lying Oxygen Therapy SpO2: 96 % O2 Device: Room Air Pain: Pain Assessment Pain Scale: 0-10 Pain Score: 8  Pain Type: Surgical pain Pain Location: Back Pain Orientation: Lower Pain Intervention(s): Medication (See eMAR)    Therapy/Group: Individual Therapy  Lorie Phenix 05/29/2020, 7:47 AM

## 2020-05-29 NOTE — Progress Notes (Addendum)
Every other staple removed per order. Pt tolerated well. Blanchable redness noted, no drainage or pain at site. Site dressed with non adherent   Mylo Red, LPN

## 2020-05-29 NOTE — Progress Notes (Signed)
PROGRESS NOTE   Subjective/Complaints:  Pt reports that it feels like staples near the top are stabbing him in the back- they are still in, but today is 14 days.   D/w pt and his GF- will get 1/2 every other staples removed and ask nursing to check and see if there's a particular one stabbing him.   ROS-  Pt denies SOB, abd pain, CP, N/V/C/D, and vision changes  Objective:   No results found. No results for input(s): WBC, HGB, HCT, PLT in the last 72 hours. No results for input(s): NA, K, CL, CO2, GLUCOSE, BUN, CREATININE, CALCIUM in the last 72 hours.  Intake/Output Summary (Last 24 hours) at 05/29/2020 1820 Last data filed at 05/29/2020 1230 Gross per 24 hour  Intake 960 ml  Output 650 ml  Net 310 ml        Physical Exam: Vital Signs Blood pressure 126/78, pulse (!) 106, temperature 97.7 F (36.5 C), resp. rate 20, height 5\' 9"  (1.753 m), weight 61.8 kg, SpO2 98 %.    General: awake, alert, appropriate, NAD-- in w/c, wearing TLSO- with PT and GF in hall HENT: conjugate gaze; oropharynx moist CV: tachycardic rate- regular rhythm; no JVD Pulmonary: CTA B/L; no W/R/R- good air movement GI: soft, NT, ND, (+)BS Psychiatric: appropriate- quiet Neurological: Ox3  Skin: still has honeycomb dressing on back- removed- has many staples down thoracic spine- very close together- a little dried blood near top 1/4-  Neurologic: Cranial nerves II through XII intact, motor strength is 5/5 in bilateral deltoid, bicep, tricep, grip,4/5 RIght and 2- left  hip flexor, knee extensors, ankle dorsiflexor and plantar flexor Sensory exam normal sensation to light touch in bilateral upper and lower extremities with exception of RIgh tR L1 dermatot   Musculoskeletal: Full range of motion in all 4 extremities. No joint swelling   Assessment/Plan: 1. Functional deficits which require 3+ hours per day of interdisciplinary therapy in a  comprehensive inpatient rehab setting.  Physiatrist is providing close team supervision and 24 hour management of active medical problems listed below.  Physiatrist and rehab team continue to assess barriers to discharge/monitor patient progress toward functional and medical goals  Care Tool:  Bathing    Body parts bathed by patient: Right arm,Left arm,Chest,Abdomen,Front perineal area,Right upper leg,Left upper leg,Face   Body parts bathed by helper: Buttocks,Right lower leg,Left lower leg     Bathing assist Assist Level: Minimal Assistance - Patient > 75%     Upper Body Dressing/Undressing Upper body dressing   What is the patient wearing?: Orthosis    Upper body assist Assist Level: Minimal Assistance - Patient > 75%    Lower Body Dressing/Undressing Lower body dressing      What is the patient wearing?: Pants     Lower body assist Assist for lower body dressing: Moderate Assistance - Patient 50 - 74%     Toileting Toileting    Toileting assist Assist for toileting: Independent with assistive device     Transfers Chair/bed transfer  Transfers assist     Chair/bed transfer assist level: Supervision/Verbal cueing     Locomotion Ambulation   Ambulation assist  Assist level: Contact Guard/Touching assist Assistive device: Walker-rolling Max distance: 53ft   Walk 10 feet activity   Assist     Assist level: Contact Guard/Touching assist Assistive device: Walker-rolling   Walk 50 feet activity   Assist    Assist level: Minimal Assistance - Patient > 75% Assistive device: Walker-rolling    Walk 150 feet activity   Assist Walk 150 feet activity did not occur: Safety/medical concerns         Walk 10 feet on uneven surface  activity   Assist Walk 10 feet on uneven surfaces activity did not occur: Safety/medical concerns         Wheelchair     Assist Will patient use wheelchair at discharge?: Yes Type of Wheelchair:  Manual    Wheelchair assist level: Supervision/Verbal cueing Max wheelchair distance: 300'    Wheelchair 50 feet with 2 turns activity    Assist        Assist Level: Supervision/Verbal cueing   Wheelchair 150 feet activity     Assist      Assist Level: Supervision/Verbal cueing   Blood pressure 126/78, pulse (!) 106, temperature 97.7 F (36.5 C), resp. rate 20, height 5\' 9"  (1.753 m), weight 61.8 kg, SpO2 98 %.   Medical Problem List and Plan: 1.  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  Back brace when out of bed             -patient may  Shower if can cover back brace?             -ELOS/Goals: 7-10d days- mod I 2.  Antithrombotics: -DVT/anticoagulation: Lovenox. Normal  vascular study             -antiplatelet therapy: N/A 3. Pain Management: Tramadol 100 mg every 6 hours, Neurontin 600 mg 3 times daily, Lidoderm patch as directed, Robaxin 1000 mg every 8 hours, oxycodone 10-15mg  q 4h   as needed Increase oxy CR 15mg  qhs  2/26- staples causing pain- will address by removing some of them- it's been 14 days 4. Mood: Provide emotional support             -antipsychotic agents: N/A 5. Neuropsych: This patient is capable of making decisions on his own behalf. 6. Skin/Wound Care: Routine skin checks  2/26- remove every other staples today and then suggest removing the rest Monday. Add nonadherent dressing daily and prn 7. Fluids/Electrolytes/Nutrition: Routine in and outs with follow-up chemistries 8.  Acute blood loss anemia.  Follow-up CBC  2/26- Hb up to 12.4- doing great- con't regimen 9.  Neurogenic bowel and bladder. Off urecholine ,  Colace 100 mg twice daily, MiraLAX twice daily -also added Flomax 0.4 mg nightly since still having difficulty starting his stream- U/A (-)  2/22-   2/26- says voiding a little better- con't regimen 10.  History of tobacco and Heroin use.  Provide counseling consult neuropsych- may benenfit from referral to suboxone clinic     LOS: 4 days A FACE TO FACE EVALUATION WAS PERFORMED  Thomas Hodge 05/29/2020, 6:20 PM

## 2020-05-29 NOTE — Plan of Care (Signed)
  Problem: RH Bathing Goal: LTG Patient will bathe all body parts with assist levels (OT) Description: LTG: Patient will bathe all body parts with assist levels (OT) Flowsheets (Taken 05/29/2020 1448) LTG: Pt will perform bathing with assistance level/cueing: (Goal downgraded 2/2 slower than ant progress) Minimal Assistance - Patient > 75% Note: Goal downgraded 2/2 slower than ant progress   Problem: RH Dressing Goal: LTG Patient will perform lower body dressing w/assist (OT) Description: LTG: Patient will perform lower body dressing with assist, with/without cues in positioning using equipment (OT) Flowsheets (Taken 05/29/2020 1448) LTG: Pt will perform lower body dressing with assistance level of: (Goal downgraded 2/2 slower than ant progress) Minimal Assistance - Patient > 75% Note: Goal downgraded 2/2 slower than ant progress

## 2020-05-29 NOTE — Progress Notes (Addendum)
Pt valuables envelope returned to pt girlfriend. Signatures obtained. Nurse copy placed in chart, Mylo Red, LPN

## 2020-05-30 MED ORDER — DULOXETINE HCL 30 MG PO CPEP
30.0000 mg | ORAL_CAPSULE | Freq: Every day | ORAL | Status: DC
  Administered 2020-05-30 – 2020-05-31 (×2): 30 mg via ORAL
  Filled 2020-05-30 (×2): qty 1

## 2020-05-30 MED ORDER — OXYCODONE-ACETAMINOPHEN 5-325 MG PO TABS
2.0000 | ORAL_TABLET | Freq: Four times a day (QID) | ORAL | Status: DC | PRN
  Administered 2020-05-30 – 2020-05-31 (×3): 2 via ORAL
  Filled 2020-05-30 (×3): qty 2

## 2020-05-30 NOTE — Progress Notes (Signed)
Staff report cloud of smoke in pt room. This nurse went in to remind visitor that if she has a vape in her possession to kindly not use it in the facility. Pt visitor stated "I dont know what it is with your hostility". I reminded her that it is my job to [employer]. Reminded visitor that if directions arent follow she will be asked to leave. Charge nurse dauda notified, in to talk to pt visitor.  Mylo Red, LPN

## 2020-05-30 NOTE — Progress Notes (Signed)
PROGRESS NOTE   Subjective/Complaints:  Pt reports doesn't feel like staples stabbing him anymore since 1/2 staples removed.  Having more pain- trying kpad and ice and "doesn't work"- wants me to either increase dose or make meds more often.  I explained with goal of stopping his meds before d/c- that doesn't seem prudent- will change Oxy to Percocet since sometimes lasts longer than Oxy  Said pain is in legs and back- describes both aching and stabbing, and burning.  Cannot usually use NSAIDs while on Lovenox and after back fusion, but will add Duloxetine for nerve pain.   ROS-   Pt denies SOB, abd pain, CP, N/V/C/D, and vision changes   Objective:   No results found. No results for input(s): WBC, HGB, HCT, PLT in the last 72 hours. No results for input(s): NA, K, CL, CO2, GLUCOSE, BUN, CREATININE, CALCIUM in the last 72 hours.  Intake/Output Summary (Last 24 hours) at 05/30/2020 1412 Last data filed at 05/30/2020 0700 Gross per 24 hour  Intake 600 ml  Output 500 ml  Net 100 ml        Physical Exam: Vital Signs Blood pressure 122/72, pulse 83, temperature 97.8 F (36.6 C), resp. rate 18, height 5\' 9"  (1.753 m), weight 61.8 kg, SpO2 98 %.     General: awake, alert, appropriate, NAD- laying in bed on back- asked frequently about meds HENT: conjugate gaze; oropharynx moist CV: regular rate; no JVD Pulmonary: CTA B/L; no W/R/R- good air movement GI: soft, NT, ND, (+)BS Psychiatric: appropriate, but perseverative Neurological: Ox3 Skin: dry dressing in place C/D/I Neurologic: Cranial nerves II through XII intact, motor strength is 5/5 in bilateral deltoid, bicep, tricep, grip,4/5 RIght and 2- left  hip flexor, knee extensors, ankle dorsiflexor and plantar flexor Sensory exam normal sensation to light touch in bilateral upper and lower extremities with exception of RIgh tR L1 dermatot   Musculoskeletal: Full range  of motion in all 4 extremities. No joint swelling   Assessment/Plan: 1. Functional deficits which require 3+ hours per day of interdisciplinary therapy in a comprehensive inpatient rehab setting.  Physiatrist is providing close team supervision and 24 hour management of active medical problems listed below.  Physiatrist and rehab team continue to assess barriers to discharge/monitor patient progress toward functional and medical goals  Care Tool:  Bathing    Body parts bathed by patient: Right arm,Left arm,Chest,Abdomen,Front perineal area,Right upper leg,Left upper leg,Face   Body parts bathed by helper: Buttocks,Right lower leg,Left lower leg     Bathing assist Assist Level: Minimal Assistance - Patient > 75%     Upper Body Dressing/Undressing Upper body dressing   What is the patient wearing?: Orthosis    Upper body assist Assist Level: Minimal Assistance - Patient > 75%    Lower Body Dressing/Undressing Lower body dressing      What is the patient wearing?: Pants     Lower body assist Assist for lower body dressing: Moderate Assistance - Patient 50 - 74%     Toileting Toileting    Toileting assist Assist for toileting: Independent with assistive device     Transfers Chair/bed transfer  Transfers assist  Chair/bed transfer assist level: Supervision/Verbal cueing     Locomotion Ambulation   Ambulation assist      Assist level: Contact Guard/Touching assist Assistive device: Walker-rolling Max distance: 70ft   Walk 10 feet activity   Assist     Assist level: Contact Guard/Touching assist Assistive device: Walker-rolling   Walk 50 feet activity   Assist    Assist level: Minimal Assistance - Patient > 75% Assistive device: Walker-rolling    Walk 150 feet activity   Assist Walk 150 feet activity did not occur: Safety/medical concerns         Walk 10 feet on uneven surface  activity   Assist Walk 10 feet on uneven  surfaces activity did not occur: Safety/medical concerns         Wheelchair     Assist Will patient use wheelchair at discharge?: Yes Type of Wheelchair: Manual    Wheelchair assist level: Supervision/Verbal cueing Max wheelchair distance: 300'    Wheelchair 50 feet with 2 turns activity    Assist        Assist Level: Supervision/Verbal cueing   Wheelchair 150 feet activity     Assist      Assist Level: Supervision/Verbal cueing   Blood pressure 122/72, pulse 83, temperature 97.8 F (36.6 C), resp. rate 18, height 5\' 9"  (1.753 m), weight 61.8 kg, SpO2 98 %.   Medical Problem List and Plan: 1.  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  Back brace when out of bed             -patient may  Shower if can cover back brace?             -ELOS/Goals: 7-10d days- mod I 2.  Antithrombotics: -DVT/anticoagulation: Lovenox. Normal  vascular study             -antiplatelet therapy: N/A 3. Pain Management: Tramadol 100 mg every 6 hours, Neurontin 600 mg 3 times daily, Lidoderm patch as directed, Robaxin 1000 mg every 8 hours, oxycodone 10-15mg  q 4h   as needed Increase oxy CR 15mg  qhs  2/26- staples causing pain- will address by removing some of them- it's been 14 days  2/27- changed Oxy to Percocet same dose so might last longer- also added Duloxetine 30 mg QHS for nerv epain 4. Mood: Provide emotional support             -antipsychotic agents: N/A 5. Neuropsych: This patient is capable of making decisions on his own behalf. 6. Skin/Wound Care: Routine skin checks  2/26- remove every other staples today and then suggest removing the rest Monday. Add nonadherent dressing daily and prn 7. Fluids/Electrolytes/Nutrition: Routine in and outs with follow-up chemistries 8.  Acute blood  loss anemia.  Follow-up CBC  2/26- Hb up to 12.4- doing great- con't regimen 9.  Neurogenic bowel and bladder. Off urecholine ,  Colace 100 mg twice daily, MiraLAX twice daily -also added Flomax 0.4 mg nightly since still having difficulty starting his stream- U/A (-) 2/22-   2/26- says voiding a little better- con't regimen 10.  History of tobacco and Heroin use.  Provide counseling consult neuropsych- may benenfit from referral to suboxone clinic     I spent a total of 25 minutes on visit- today- went back to pt's room total 3x due to concerns about pain as well  as vaping in the room- d/w nursing and Charge nurse- >50% on coordination of care.   LOS: 5 days A FACE TO FACE EVALUATION WAS PERFORMED  Megan Lovorn 05/30/2020, 2:12 PM

## 2020-05-31 ENCOUNTER — Other Ambulatory Visit: Payer: Self-pay | Admitting: Physician Assistant

## 2020-05-31 MED ORDER — METHOCARBAMOL 500 MG PO TABS: 1000.0000 mg | ORAL_TABLET | Freq: Three times a day (TID) | ORAL | 0 refills | Status: AC

## 2020-05-31 MED ORDER — OXYCODONE HCL ER 10 MG PO T12A
10.0000 mg | EXTENDED_RELEASE_TABLET | Freq: Every day | ORAL | Status: DC
  Administered 2020-05-31: 10 mg via ORAL
  Filled 2020-05-31: qty 1

## 2020-05-31 MED ORDER — DULOXETINE HCL 30 MG PO CPEP: 30.0000 mg | ORAL_CAPSULE | Freq: Every day | ORAL | 0 refills | Status: AC

## 2020-05-31 MED ORDER — DOCUSATE SODIUM 100 MG PO CAPS: 100.0000 mg | ORAL_CAPSULE | Freq: Two times a day (BID) | ORAL | 0 refills | Status: AC

## 2020-05-31 MED ORDER — OXYCODONE-ACETAMINOPHEN 5-325 MG PO TABS
1.0000 | ORAL_TABLET | Freq: Four times a day (QID) | ORAL | Status: DC | PRN
  Administered 2020-05-31 – 2020-06-01 (×4): 1 via ORAL
  Filled 2020-05-31 (×4): qty 1

## 2020-05-31 MED ORDER — BISACODYL 10 MG RE SUPP: 10.0000 mg | Freq: Every day | RECTAL | 0 refills | Status: DC | PRN

## 2020-05-31 MED ORDER — POLYETHYLENE GLYCOL 3350 17 G PO PACK: 17.0000 g | PACK | Freq: Two times a day (BID) | ORAL | 0 refills | Status: DC

## 2020-05-31 MED ORDER — LIDOCAINE 5 % EX PTCH: 1.0000 | MEDICATED_PATCH | CUTANEOUS | 0 refills | Status: AC

## 2020-05-31 MED ORDER — TAMSULOSIN HCL 0.4 MG PO CAPS: 0.4000 mg | ORAL_CAPSULE | Freq: Every day | ORAL | 0 refills | Status: AC

## 2020-05-31 MED ORDER — ACETAMINOPHEN 325 MG PO TABS: 325.0000 mg | ORAL_TABLET | ORAL | Status: AC | PRN

## 2020-05-31 MED ORDER — OXYCODONE-ACETAMINOPHEN 7.5-325 MG PO TABS
1.0000 | ORAL_TABLET | Freq: Four times a day (QID) | ORAL | Status: DC | PRN
Start: 2020-05-31 — End: 2020-05-31
  Filled 2020-05-31: qty 1

## 2020-05-31 MED ORDER — TRAMADOL HCL 50 MG PO TABS: ORAL_TABLET | ORAL | 0 refills | Status: DC

## 2020-05-31 MED ORDER — GABAPENTIN 400 MG PO CAPS
800.0000 mg | ORAL_CAPSULE | Freq: Three times a day (TID) | ORAL | Status: DC
  Administered 2020-05-31 – 2020-06-01 (×4): 800 mg via ORAL
  Filled 2020-05-31 (×4): qty 2

## 2020-05-31 MED ORDER — TRAMADOL HCL 50 MG PO TABS: ORAL_TABLET | ORAL | 0 refills | Status: AC

## 2020-05-31 MED ORDER — GABAPENTIN 400 MG PO CAPS: 800.0000 mg | ORAL_CAPSULE | Freq: Three times a day (TID) | ORAL | 0 refills | Status: AC

## 2020-05-31 MED FILL — DULoxetine HCL 30 MG CPEP: 30 | 30 days supply | Qty: 30 | Fill #0

## 2020-05-31 MED FILL — TAMSULOSIN HCL 0.4 MG CAP: 0.4 | 30 days supply | Qty: 30 | Fill #0

## 2020-05-31 MED FILL — traMADol HCL 50 MG TABS: 50 | 12 days supply | Qty: 30 | Fill #0

## 2020-05-31 MED FILL — METHOCARBAMOL 500 MG TABS: 500 | 15 days supply | Qty: 90 | Fill #0

## 2020-05-31 MED FILL — LIDOCAINE PATCH 5%: 5 | 30 days supply | Qty: 30 | Fill #0

## 2020-05-31 MED FILL — GABAPENTIN 400 MG CAPSULE: 400 | 30 days supply | Qty: 180 | Fill #0
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
## 2020-05-31 NOTE — Progress Notes (Signed)
Patient ID: Thomas Hodge, male   DOB: [birth year], 25 y.o.   MRN: [id number]   Patient MATCH entered in system.  [place], [place] [phone]

## 2020-05-31 NOTE — Progress Notes (Signed)
Occupational Therapy Discharge Summary  Patient Details  Name: Fedrick Cefalu MRN: [id number] Date of Birth: 05/19/1995   Patient has met 8 of 9 long term goals due to improved activity tolerance, improved balance, postural control, ability to compensate for deficits and improved coordination.  Patient to discharge at overall Supervision level.  Patient's care partner is independent to provide the necessary physical assistance at discharge.    Reasons goals not met: Shower transfer goal not met 2/2 req min A to manage LLE during tub-shower transfer. Pt cont to be limited by LLE weakness, surgical pain, and decreased activity tolerance compared to baseline.  Recommendation:  Patient will benefit from ongoing skilled OT services in home health setting to continue to advance functional skills in the area of BADL, iADL and Reduce care partner burden.  Equipment: padded 3in1, padded TTB, reacher, sock aide  Reasons for discharge: discharge from hospital  Patient/family agrees with progress made and goals achieved: Yes  OT Discharge Precautions/Restrictions  Precautions Precautions: Fall;Back Required Braces or Orthoses: Spinal Brace Spinal Brace: Thoracolumbosacral orthotic;Applied in sitting position Restrictions Weight Bearing Restrictions: No   Vital Signs Therapy Vitals Temp: 98.3 F (36.8 C) Temp Source: Oral Pulse Rate: (!) 103 Resp: 18 BP: 118/73 Patient Position (if appropriate): Lying Oxygen Therapy SpO2: 98 % O2 Device: Room Air Pain Pain Assessment Pain Scale: 0-10 Pain Score: 6  Pain Type: Surgical pain Pain Location: Leg Pain Orientation: Right;Left ADL ADL Equipment Provided: Reacher,Sock aid Eating: Independent Where Assessed-Eating: Wheelchair,Bed level,Chair,Edge of bed Grooming: Modified independent Where Assessed-Grooming: Sitting at sink,Edge of bed,Chair,Wheelchair,Bed level Upper Body Bathing: Supervision/safety Where Assessed-Upper Body  Bathing: Shower Lower Body Bathing: Minimal assistance Where Assessed-Lower Body Bathing: Shower Upper Body Dressing: Supervision/safety Where Assessed-Upper Body Dressing: Edge of bed Lower Body Dressing: Minimal assistance Where Assessed-Lower Body Dressing: Edge of bed,Bed level Toileting: Supervision/safety Where Assessed-Toileting: Toilet,Bedside Commode Toilet Transfer: Close supervision Toilet Transfer Method: Arts development officer: Bedside commode,Grab bars Tub/Shower Transfer: Metallurgist Method: Optometrist: Engineer, water: Curator Method: Heritage manager: Network engineer bench,Grab bars Vision Baseline Vision/History: No visual deficits Patient Visual Report: No change from baseline Vision Assessment?: No apparent visual deficits Perception  Perception: Within Functional Limits Praxis Praxis: Intact Cognition Overall Cognitive Status: Within Functional Limits for tasks assessed Arousal/Alertness: Awake/alert Orientation Level: Oriented X4 Attention: Selective Selective Attention: Appears intact Memory: Appears intact Awareness: Appears intact Problem Solving: Appears intact Safety/Judgment: Appears intact Sensation Sensation Light Touch:  (mild impairment in R hip/low back area but intact in BLEs) Hot/Cold: Appears Intact Proprioception: Appears Intact Stereognosis: Appears Intact Coordination Gross Motor Movements are Fluid and Coordinated: Yes Fine Motor Movements are Fluid and Coordinated: Yes Coordination and Movement Description: grossly WFL for ADL / func transfers, cont LLE weakness but greatly improved since eval Finger Nose Finger Test: WNL Motor  Motor Motor: Within Functional Limits Motor - Discharge Observations: grossly WFL for ADL/func transfers, cont LLE weakness/surgical pain slowing gross motor  movements but greatly improved since eval Mobility  Bed Mobility Bed Mobility: Rolling Right;Right Sidelying to Sit;Sit to Sidelying Right Rolling Right: Independent with assistive device Right Sidelying to Sit: Independent with assistive device Sit to Sidelying Right: Independent with assistive device Transfers Sit to Stand: Supervision/Verbal cueing Stand to Sit: Supervision/Verbal cueing  Trunk/Postural Assessment  Cervical Assessment Cervical Assessment: Within Functional Limits Thoracic Assessment Thoracic Assessment: Exceptions to [facility] Lumbar Assessment Lumbar Assessment: Exceptions to [facility] Postural  Control Postural Control: Within Functional Limits  Balance Balance Balance Assessed: Yes Static Sitting Balance Static Sitting - Balance Support: Feet supported Static Sitting - Level of Assistance: 6: Modified independent (Device/Increase time) Dynamic Sitting Balance Dynamic Sitting - Balance Support: Feet supported Dynamic Sitting - Level of Assistance: 5: Stand by assistance Static Standing Balance Static Standing - Balance Support: Bilateral upper extremity supported Static Standing - Level of Assistance: 5: Stand by assistance Dynamic Standing Balance Dynamic Standing - Balance Support: During functional activity;Bilateral upper extremity supported Dynamic Standing - Level of Assistance: 5: Stand by assistance Extremity/Trunk Assessment RUE Assessment RUE Assessment: Within Functional Limits LUE Assessment LUE Assessment: Within Functional Limits   Volanda Napoleon MS, OTR/L  05/31/2020, 9:16 PM

## 2020-05-31 NOTE — Progress Notes (Signed)
Patient ID: Thomas Hodge, male   DOB: 1995-07-30, 25 y.o.   MRN: [id number]   DME ordered through [employer].  [place], [place] [phone]

## 2020-05-31 NOTE — Progress Notes (Signed)
Occupational Therapy Session Note  Patient Details  Name: Thomas Hodge MRN: [id number] Date of Birth: May 11, 1995  Today's Date: 05/31/2020 OT Individual Time: 7902-4097 OT Individual Time Calculation (min): 40 min    Short Term Goals: Week 1:  OT Short Term Goal 1 (Week 1): STGs = LTGs d/t ELOS at Supervision  Skilled Therapeutic Interventions/Progress Updates:    Treatment session with focus on LB dressing and BUE strengthening.  Pt received upright in w/c reporting pain in back and lower legs - reporting MD is decreasing pain meds in preparation for d/c home.  Discussed progress towards goals and pt reports still having difficulty with LB dressing.  Pt reports will most likely dress from EOB, therefore pt transferred to EOB CGA.  Therapist educated on use of reacher with threading LLE and then use of figure 4 with RLE to increase independence while maintaining back precautions.  Pt pulled pants over hips with partial boost up and lateral leans combination.  Pt transferred back to w/c CGA.  Pt propelled w/c to therapy gym.  Engaged in 3 sets of 15 bicep curls with 3# and chest presses, singled handed shoulder presses, and abduction 2 sets of 10 with 2# weight.  Discussed home items that he can utilize for weight during UB strengthening vs purchasing hand weights.  Returned to room and transferred back to bed CGA squat pivot.  Pt doffed TLSO with supervision and required assist to remove L shoe with AFO.  Pt returned to supine, therapist placed heating pad, and pt remained supine with all needs in reach.  Therapy Documentation Precautions:  Precautions Precautions: Fall,Back Precaution Comments: reviewed back precautions, able to recall 2/3 Required Braces or Orthoses: Spinal Brace Spinal Brace: Thoracolumbosacral orthotic,Applied in sitting position Restrictions Weight Bearing Restrictions: No Pain: Pain Assessment Pain Scale: 0-10 Pain Score: 5  Pain Location: Leg Pain  Orientation: Right;Left Pain Intervention(s): Medication (See eMAR)   Therapy/Group: Individual Therapy  Rosalio Loud 05/31/2020, 12:16 PM

## 2020-05-31 NOTE — Progress Notes (Signed)
Mother [name] called per pt request to update on pt discharge in the AM. Questions answered. Girlfriend plan to pick pt up at 0930-10.  Mylo Red, LPN

## 2020-05-31 NOTE — Plan of Care (Signed)
  Problem: RH Tub/Shower Transfers Goal: LTG Patient will perform tub/shower transfers w/assist (OT) Description: LTG: Patient will perform tub/shower transfers with assist, with/without cues using equipment (OT) Outcome: Not Met (add Reason) Flowsheets (Taken 05/31/2020 2119) LTG: Pt will perform tub/shower stall transfers with assistance level of: (Not met 2/2 req min A to manage LLE during tub-shower transfer, cont LLE weakness, surgical pain.) -- Note: Not met 2/2 req min A to manage LLE during tub-shower transfer, cont LLE weakness, surgical pain.   Problem: RH Balance Goal: LTG Patient will maintain dynamic standing with ADLs (OT) Description: LTG:  Patient will maintain dynamic standing balance with assist during activities of daily living (OT)  Outcome: Completed/Met   Problem: Sit to Stand Goal: LTG:  Patient will perform sit to stand in prep for activites of daily living with assistance level (OT) Description: LTG:  Patient will perform sit to stand in prep for activites of daily living with assistance level (OT) Outcome: Completed/Met   Problem: RH Grooming Goal: LTG Patient will perform grooming w/assist,cues/equip (OT) Description: LTG: Patient will perform grooming with assist, with/without cues using equipment (OT) Outcome: Completed/Met   Problem: RH Bathing Goal: LTG Patient will bathe all body parts with assist levels (OT) Description: LTG: Patient will bathe all body parts with assist levels (OT) Outcome: Completed/Met   Problem: RH Dressing Goal: LTG Patient will perform upper body dressing (OT) Description: LTG Patient will perform upper body dressing with assist, with/without cues (OT). Outcome: Completed/Met Goal: LTG Patient will perform lower body dressing w/assist (OT) Description: LTG: Patient will perform lower body dressing with assist, with/without cues in positioning using equipment (OT) Outcome: Completed/Met   Problem: RH Toileting Goal: LTG  Patient will perform toileting task (3/3 steps) with assistance level (OT) Description: LTG: Patient will perform toileting task (3/3 steps) with assistance level (OT)  Outcome: Completed/Met   Problem: RH Toilet Transfers Goal: LTG Patient will perform toilet transfers w/assist (OT) Description: LTG: Patient will perform toilet transfers with assist, with/without cues using equipment (OT) Outcome: Completed/Met

## 2020-05-31 NOTE — Discharge Instructions (Signed)
Inpatient Rehab Discharge Instructions  Thomas Hodge Discharge date and time: No discharge date for patient encounter.   Activities/Precautions/ Functional Status: Activity: Back brace when out of bed Diet: regular diet Wound Care: Routine skin checks Functional status:  ___ No restrictions     ___ Walk up steps independently ___ 24/7 supervision/assistance   ___ Walk up steps with assistance ___ Intermittent supervision/assistance  ___ Bathe/dress independently ___ Walk with walker     _x__ Bathe/dress with assistance ___ Walk Independently    ___ Shower independently ___ Walk with assistance    ___ Shower with assistance ___ No alcohol     ___ Return to work/school ________  Special Instructions:  No driving smoking or alcohol  Follow-up [facility] [place] Suboxone clinic [phone]  My questions have been answered and I understand these instructions. I will adhere to these goals and the provided educational materials after my discharge from the hospital.  Patient/Caregiver Signature _______________________________ Date __________  Clinician Signature _______________________________________ Date __________  Please bring this form and your medication list with you to all your follow-up doctor's appointments.

## 2020-05-31 NOTE — Progress Notes (Signed)
Orthopedic Tech Progress Note Patient Details:  Thomas Hodge 11-27-1995 [id number] Called in order to [employer] for an AFO for the LEFT SIDE Patient ID: [name], male   DOB: 17-Mar-1996, 25 y.o.   MRN: [id number]   Donald Pore 05/31/2020, 8:10 AM

## 2020-05-31 NOTE — Progress Notes (Addendum)
PROGRESS NOTE   Subjective/Complaints:  Slept well , tolerating therapy , feels good with 1/2 staples out  Pain under good control  Discussed Heroin use, pt wishes to have referral to [facility] medicine in [place], [place] (where his girlfriend goes) ROS-   Pt denies SOB, abd pain, CP, N/V/C/D, and vision changes   Objective:   No results found. No results for input(s): WBC, HGB, HCT, PLT in the last 72 hours. No results for input(s): NA, K, CL, CO2, GLUCOSE, BUN, CREATININE, CALCIUM in the last 72 hours.  Intake/Output Summary (Last 24 hours) at 05/31/2020 0835 Last data filed at 05/31/2020 0300 Gross per 24 hour  Intake 960 ml  Output 425 ml  Net 535 ml        Physical Exam: Vital Signs Blood pressure 123/74, pulse 86, temperature 97.8 F (36.6 C), resp. rate 18, height 5\' 9"  (1.753 m), weight 61.8 kg, SpO2 97 %.  General: No acute distress Mood and affect are appropriate Heart: Regular rate and rhythm no rubs murmurs or extra sounds Lungs: Clear to auscultation, breathing unlabored, no rales or wheezes Abdomen: Positive bowel sounds, soft nontender to palpation, nondistended Extremities: No clubbing, cyanosis, or edema Skin: No evidence of breakdown, no evidence of rash- staples intact (1/2 removed)   Neurologic: Cranial nerves II through XII intact, motor strength is 5/5 in bilateral deltoid, bicep, tricep, grip,4/5 RIght and 2- left  hip flexor, knee extensors, ankle dorsiflexor and plantar flexor Sensory exam normal sensation to light touch in bilateral upper and lower extremities with exception of RIgh tR L1 dermatot   Musculoskeletal: Full range of motion in all 4 extremities. No joint swelling   Assessment/Plan: 1. Functional deficits which require 3+ hours per day of interdisciplinary therapy in a comprehensive inpatient rehab setting.  Physiatrist is providing close team supervision and 24 hour  management of active medical problems listed below.  Physiatrist and rehab team continue to assess barriers to discharge/monitor patient progress toward functional and medical goals  Care Tool:  Bathing    Body parts bathed by patient: Right arm,Left arm,Chest,Abdomen,Front perineal area,Right upper leg,Left upper leg,Face   Body parts bathed by helper: Buttocks,Right lower leg,Left lower leg     Bathing assist Assist Level: Minimal Assistance - Patient > 75%     Upper Body Dressing/Undressing Upper body dressing   What is the patient wearing?: Orthosis    Upper body assist Assist Level: Minimal Assistance - Patient > 75%    Lower Body Dressing/Undressing Lower body dressing      What is the patient wearing?: Pants     Lower body assist Assist for lower body dressing: Moderate Assistance - Patient 50 - 74%     Toileting Toileting    Toileting assist Assist for toileting: Independent with assistive device     Transfers Chair/bed transfer  Transfers assist     Chair/bed transfer assist level: Supervision/Verbal cueing     Locomotion Ambulation   Ambulation assist      Assist level: Contact Guard/Touching assist Assistive device: Walker-rolling Max distance: 3ft   Walk 10 feet activity   Assist     Assist level: Contact Guard/Touching assist Assistive  device: Walker-rolling   Walk 50 feet activity   Assist    Assist level: Minimal Assistance - Patient > 75% Assistive device: Walker-rolling    Walk 150 feet activity   Assist Walk 150 feet activity did not occur: Safety/medical concerns         Walk 10 feet on uneven surface  activity   Assist Walk 10 feet on uneven surfaces activity did not occur: Safety/medical concerns         Wheelchair     Assist Will patient use wheelchair at discharge?: Yes Type of Wheelchair: Manual    Wheelchair assist level: Supervision/Verbal cueing Max wheelchair distance: 300'     Wheelchair 50 feet with 2 turns activity    Assist        Assist Level: Supervision/Verbal cueing   Wheelchair 150 feet activity     Assist      Assist Level: Supervision/Verbal cueing   Blood pressure 123/74, pulse 86, temperature 97.8 F (36.6 C), resp. rate 18, height 5\' 9"  (1.753 m), weight 61.8 kg, SpO2 97 %.   Medical Problem List and Plan: 1.  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  Back brace when out of bed             -patient may  Shower if can cover back brace?             -ELOS/Goals: Plan D/C in am  2.  Antithrombotics: -DVT/anticoagulation: Lovenox. Normal  vascular study- amb > 137ft with therapy will d/c lovenox prior to discharge             -antiplatelet therapy: N/A 3. Pain Management: Tramadol 100 mg every 6 hours, Neurontin 600 mg 3 times daily, Lidoderm patch as directed, Robaxin 1000 mg every 8 hours, o tolerated reduced dose of 10mg  q 6h , will reduce to 5mg  q6 h x 1 day then d/c after pt leasves hospital Will start wean of tramadol reduce to 100mg  q 8h x 3d, 50mg  TID x 3d, 50mg  BID x 3d, 50mg  qd x 3d then stop decrease oxy CR 10mg  qhs tonight , then d/c  Increase gabapentin to 800mg  TID Referral to suboxone clinic  2/26- staples causing pain- will address by removing some of them- it's been 14 days  2/27- changed Oxy to Percocet same dose so might last longer- also added Duloxetine 30 mg QHS for nerv epain 4. Mood: Provide emotional support             -antipsychotic agents: N/A 5. Neuropsych: This patient is capable of making decisions on his own behalf. 6. Skin/Wound Care: Routine skin checks  2/26- remove every other staples today and then suggest removing the rest Monday. Remove remainder of staples prior to discharge  7.  Fluids/Electrolytes/Nutrition: Routine in and outs with follow-up chemistries 8.  Acute blood loss anemia.  Follow-up CBC  2/26- Hb up to 12.4- doing great- con't regimen 9.  Neurogenic bowel and bladder. Off urecholine ,  Colace 100 mg twice daily, MiraLAX twice daily -also added Flomax 0.4 mg nightly since still having difficulty starting his stream- U/A (-) 2/22-   2/26- says voiding a little better- con't regimen 10.  History of tobacco and Heroin use.   referral to suboxone clinic - [facility] in [place]    LOS: 6 days A FACE  TO FACE EVALUATION WAS PERFORMED  Erick Colace 05/31/2020, 8:35 AM

## 2020-05-31 NOTE — Progress Notes (Signed)
Patient ID: Thomas Hodge, male   DOB: 15-Oct-1995, 25 y.o.   MRN: [id number]   Patient unable to receive HH due to no insurance and patient lives in [place], [place]  [place], [place] [phone]

## 2020-06-01 LAB — CREATININE, SERUM
Creatinine, Ser: 0.67 mg/dL (ref 0.61–1.24)
GFR, Estimated: >60 mL/min (ref >60)

## 2020-06-01 NOTE — Progress Notes (Signed)
Patient discharged with family. Discharge instructions gone over with PA. Dressing on back changed this morning, steri strips applied.

## 2020-06-01 NOTE — Progress Notes (Signed)
Pt's staples removed from his back. Small amount of serosanguineous drainage noted. Dry sterile telfa dressing applied patient tolerated well.

## 2020-06-01 NOTE — Progress Notes (Signed)
Physical Therapy Discharge Summary  Patient Details  Name: Thomas Hodge MRN: [id number] Date of Birth: [birth year]  Today's Date: 05/31/2020 PT Individual Time:  1001-1100 and 1400-1528  PT Individual Time Calculation (min): 59 min and 85mn   Patient has met 8 of 10 long term goals due to improved activity tolerance, improved balance, increased strength, decreased pain, functional use of  left lower extremity and improved coordination.  Patient to discharge at an ambulatory and wheelchair level Supervision.   Patient's care partner is independent to provide the necessary physical assistance at discharge.  Reasons goals not met: continued surgical pain and swelling at surgical site with impairment from L1 injury  Recommendation:  Patient will benefit from ongoing skilled PT services in home health setting to continue to advance safe functional mobility, address ongoing impairments in weakness, pain, balance, awareness of impairments and risk of injury,  and minimize fall risk.  Equipment: RW, w/c, 3-in-1, TTB  Reasons for discharge: treatment goals met and discharge from hospital  Patient/family agrees with progress made and goals achieved: Yes  PT Discharge Precautions/Restrictions Fall risk, back precautions (no bending, lifting, twisting)  Vital Signs Therapy Vitals Temp: 98.3 F (36.8 C) Temp Source: Oral Pulse Rate: 72 Resp: 18 BP: 116/72 Patient Position (if appropriate): Lying Oxygen Therapy SpO2: 97 % O2 Device: Room Air Pain Pain Assessment Pain Scale: 0-10 Pain Score: 8 at highest; 5 at lowest Pain Type: Acute, Surgical pain Pain Location: Back Pain Descriptors / Indicators: Aching, burning Pain Frequency: Constant Pain Onset: On-going Patients Stated Pain Goal: 3 Pain Intervention(s): Medication (See eMAR) Vision/Perception  Baseline Vision/History: No visual deficits Patient Visual Report: No change from baseline Vision Assessment?: No apparent  visual deficits Perception  Perception: Within Functional Limits Praxis Praxis: Intact Cognition Overall Cognitive Status: Within Functional Limits for tasks assessed Arousal/Alertness: Awake/alert Orientation Level: Oriented X4 Safety/Judgment: Appears Intact Sensation Light Touch: Appears Intact Coordination Gross Motor Movements are Fluid and Coordinated: Yes Fine Motor Movements are Fluid and Coordinated: No Coordination and Movement Description: grossly WFL for ADL / func transfers, fine motor for LLE is mildly impaired with reduced coordination, cont LLE weakness but greatly improved since eval Heel Shin test: limited by sciatic neural tension in LLE, but fine motor does show mild impairment  Motor  Motor Motor: Within Functional Limits Motor - Discharge Observations: grossly WFL for ADL/func transfers, lack of LLE coordination and strength during mobility 2/2 L1 injury/surgical pain slowing gross motor movements but greatly improved since eval Mobility  Bed Mobility Bed Mobility: Rolling Right;Right Sidelying to Sit;Sit to Sidelying Right Rolling Right: Independent with assistive device Right Sidelying to Sit: Independent with assistive device Sit to Sidelying Right: Independent with assistive device Transfers Sit to Stand: Supervision/Verbal cueing Stand to Sit: Supervision/Verbal cueing  Stand Pivot Transfers:  Supervision/Verbal cueing Stand Pivot Transfer Details: Verbal cues for precautions/safety; Verbal cues for safe use of DME/AE; Verbal cues for gait pattern; Verbal cues for technique Trunk/Postural Assessment  Cervical Assessment Cervical Assessment: Within Functional Limits Thoracic Assessment Thoracic Assessment: Within Functional Limits Lumbar Assessment Lumbar Assessment: Within Functional Limits Postural Control Postural Control: Within Functional Limits  Balance Balance Balance Assessed: Yes Static Sitting Balance Static Sitting - Balance Support:  Feet supported Static Sitting - Level of Assistance: 6: Modified independent (Device/Increase time) Dynamic Sitting Balance Dynamic Sitting - Balance Support: Feet supported Dynamic Sitting - Level of Assistance: 5: Stand by assistance Static Standing Balance Static Standing - Balance Support: Bilateral upper extremity supported Static Standing -  Level of Assistance: 5: Stand by assistance Dynamic Standing Balance Dynamic Standing - Balance Support: During functional activity;Bilateral upper extremity supported Dynamic Standing - Level of Assistance: 5: Stand by assistance Extremity/Trunk Assessment RLE Assessment RLE Assessment: Exceptions to [facility] Active Range of Motion (AROM) Comments: [facility] General Strength Comments : hip flexion 4-/5, knee extension 4/5, ankle DF 4+/5 LLE Assessment LLE Assessment: Exceptions to [facility] Passive Range of Motion (AROM) Comments: sciatic neural tension pain with minimal slump test, felt especially in hamstrings, calves General Strength Comments : hip flexion 3+/5, knee extension 2+/5, ankle DF 0/5, ankle PF 2-/5, hip abduction 3-/5, adduction 3+/5, knee flexion 3+/ 5  Skilled Therapeutic Interventions/Progress Updates:  Session 1:  Patient supine in bed upon PT arrival. Patient alert and agreeable to PT session. Patient c/o pain at surgical site as well as at gluteals, popliteal area, calves especially with performance of knee extension bilaterally with LLE >RLE. Education provided to pt re: neural tension and need for neural flossing/ gliding types of stretches to be performed in order to improve and maintain nerves mobility. Pt also provided with HEP including neural glide for sciatic nerve.   Therapeutic Activity: Bed Mobility: Patient performed supine --> sit with mod I. No vc required. At end of session, pt instructed in sit -->sidelying. Instructed in abdominal bracing priior to bringing UB to side while bringing BLE up to bed surface simultaneously.  Improved to CGA with decrease in pain to perform.  Transfers: Patient performed STS to RW throughout session with Mod I/ supervision. Instructed to flex at hips for descent to hips so that BUE can reach back to w/c with improved hand placement and mechanics. Squat pivot bed <> w/c performed with Mod I.   Gait Training:  Patient ambulated 100' x4 using different AFOs in order to assess pt's gait pattern and choose appropriate AFO. Orthotist present and in agreement on best quality of ambulation using anterior support AFO as knee buckle decreased and heel strike improved with less lateral ankle eversion.  Orthotist with recommendation for AFO with increased return of energy for gentle spring forward to improve gait and is to return with AFO in afternoon session.   Stairs negotiated with use of anterior support AFO with pt initially attempting to ascend with LLE first and self corrects. Uses BUE on HR and requires vc for decreasing hip hike on L for step clearance and bringing L knee into flexion to clear foot onto step. Increased quality of knee extensor hold during descent of steps.   Patient supine in bed at end of session with brakes locked,bed  alarm set, and all needs within reach.  Session 2: Patient supine in bed upon PT arrival. Patient alert and agreeable to PT session. Patient c/o pain at LLE during session and receives pain meds from RN during session.   Therapeutic Activity: Bed Mobility: Patient performed supine--> sit with Mod I. Improved return to supine with CGA and sit--> sidelying technique.  Transfers: Patient performed toilet transfer with supervision. Car transfer performed with supervision. VC provided for using UE to assist LLE into car d/t low seat height.   Gait Training:  Patient ambulated 250' x1 with new AFO donned, using RW with supervision for shorter, in-room distances and CGA for distances >50 feet and including turns. Improved quality of gait with AFO and pt states  good comfort as well as improved confidence. Provided verbal cues for RLE step-through, LLE increased step height/ length.   Pt navigates RW up and down  ramp with good control. He is also able to navigate uneven terrain with RW and vc for picking up RW for advancement with LLE. Completes all with close supervision/ CGA for balance.   Patient supine in bed at end of session with brakes locked, bed alarm set, and all needs within reach. Provided with [facility] clinic information for schools in [place], [place] area and also provided with supine, seated, and standing HEP with instructions to start with seated HEP until [facility] services can come to home to continue care.    Alger Simons 06/01/2020, 6:27 AM

## 2020-06-01 NOTE — Progress Notes (Signed)
Inpatient Rehabilitation Care Coordinator Discharge Note  The overall goal for the admission was met for:   Discharge location: Yes, home  Length of Stay: Yes, 7 days  Discharge activity level: Yes,  ambulatory level Supervision  Home/community participation: Yes  Services provided included: MD, RD, PT, OT, SLP, RN, CM, TR, Pharmacy, Neuropsych and SW  Financial Services: Other: uninsured  Choices offered to/list presented to:n/a  Follow-up services arranged: Other: n/a: no follow up [place] due to [place]  Comments (or additional information): [employer], Conservation officer, nature, Bedside [employer], Tub Producer, television/film/video  Patient/Family verbalized understanding of follow-up arrangements: Yes  Individual responsible for coordination of the follow-up planHulen Luster, [phone]  Confirmed correct DME delivered: Dyanne Iha 06/01/2020    Dyanne Iha

## 2020-06-01 NOTE — Plan of Care (Signed)
  Problem: RH Bed Mobility Goal: LTG Patient will perform bed mobility with assist (PT) Description: LTG: Patient will perform bed mobility with assistance, with/without cues (PT). Outcome: Adequate for Discharge Flowsheets (Taken 06/01/2020 0750) LTG: Pt will perform bed mobility with assistance level of: Contact Guard/Touching assist Note: Mod I for supine--> sit CGA for sit--> supine d/t pain and strength deficits   Problem: RH Ambulation Goal: LTG Patient will ambulate in controlled environment (PT) Description: LTG: Patient will ambulate in a controlled environment, # of feet with assistance (PT). Outcome: Adequate for Discharge Flowsheets Taken 06/01/2020 0750 by Loel Dubonnet, PT LTG: Pt will ambulate in controlled environ  assist needed:: Contact Guard/Touching assist Taken 05/26/2020 1727 by Ane Payment, PT LTG: Ambulation distance in controlled environment: 150' Note: Requires CGA d/t pain and knee buckling With new AFO received today, is supervision/ CGA for shorter distances

## 2020-06-03 ENCOUNTER — Telehealth: Payer: Self-pay

## 2020-06-03 NOTE — Telephone Encounter (Addendum)
Transition Care Management Unsuccessful Follow-up Telephone Call  Date of discharge and from where:  [facility]  Attempts:  1st Attempt  Reason for unsuccessful TCM follow-up call:  Left voice message   Three attempts have been created today. No reply so far. Only one contact in chart.

## 2020-06-08 ENCOUNTER — Encounter
Payer: BC Managed Care – PPO | Attending: Physical Medicine & Rehabilitation | Admitting: Physical Medicine & Rehabilitation

## 2020-06-08 ENCOUNTER — Other Ambulatory Visit: Payer: Self-pay

## 2020-06-08 ENCOUNTER — Encounter: Payer: Self-pay | Admitting: Physical Medicine & Rehabilitation

## 2020-06-08 VITALS — BP 120/80 | HR 127 | Temp 99.1°F | Ht 69.0 in | Wt 145.4 lb

## 2020-06-08 DIAGNOSIS — S343XXS Injury of cauda equina, sequela: Secondary | ICD-10-CM | POA: Diagnosis present

## 2020-06-08 MED ORDER — TRAMADOL HCL 50 MG PO TABS: 100.0000 mg | ORAL_TABLET | Freq: Three times a day (TID) | ORAL | 0 refills | Status: AC
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
## 2020-06-08 NOTE — Patient Instructions (Signed)
Stop tramadol when subutex started

## 2022-03-17 ENCOUNTER — Encounter (HOSPITAL_COMMUNITY): Payer: Self-pay

## 2022-03-20 ENCOUNTER — Inpatient Hospital Stay (HOSPITAL_COMMUNITY)
Admit: 2022-03-20 | Discharge: 2022-03-22 | DRG: 560 | Discharge status: Against Medical Advice | Payer: BC Managed Care – PPO | Source: Other Acute Inpatient Hospital | Attending: Internal Medicine | Admitting: Internal Medicine

## 2022-03-20 ENCOUNTER — Other Ambulatory Visit: Payer: Self-pay

## 2022-03-20 ENCOUNTER — Encounter (HOSPITAL_COMMUNITY): Payer: Self-pay | Admitting: Family Medicine

## 2022-03-20 ENCOUNTER — Inpatient Hospital Stay (HOSPITAL_COMMUNITY): Payer: BC Managed Care – PPO

## 2022-03-20 DIAGNOSIS — K529 Noninfective gastroenteritis and colitis, unspecified: Secondary | ICD-10-CM | POA: Diagnosis present

## 2022-03-20 DIAGNOSIS — Z888 Allergy status to other drugs, medicaments and biological substances status: Secondary | ICD-10-CM

## 2022-03-20 DIAGNOSIS — Z79899 Other long term (current) drug therapy: Secondary | ICD-10-CM

## 2022-03-20 DIAGNOSIS — F39 Unspecified mood [affective] disorder: Secondary | ICD-10-CM | POA: Diagnosis present

## 2022-03-20 DIAGNOSIS — Z5329 Procedure and treatment not carried out because of patient's decision for other reasons: Secondary | ICD-10-CM | POA: Diagnosis present

## 2022-03-20 DIAGNOSIS — S32019D Unspecified fracture of first lumbar vertebra, subsequent encounter for fracture with routine healing: Secondary | ICD-10-CM

## 2022-03-20 DIAGNOSIS — M549 Dorsalgia, unspecified: Secondary | ICD-10-CM | POA: Diagnosis present

## 2022-03-20 DIAGNOSIS — T84216A Breakdown (mechanical) of internal fixation device of vertebrae, initial encounter: Principal | ICD-10-CM | POA: Diagnosis present

## 2022-03-20 DIAGNOSIS — Y792 Prosthetic and other implants, materials and accessory orthopedic devices associated with adverse incidents: Secondary | ICD-10-CM | POA: Diagnosis present

## 2022-03-20 DIAGNOSIS — F1120 Opioid dependence, uncomplicated: Secondary | ICD-10-CM | POA: Diagnosis present

## 2022-03-20 DIAGNOSIS — T847XXD Infection and inflammatory reaction due to other internal orthopedic prosthetic devices, implants and grafts, subsequent encounter: Secondary | ICD-10-CM | POA: Diagnosis not present

## 2022-03-20 DIAGNOSIS — D509 Iron deficiency anemia, unspecified: Secondary | ICD-10-CM | POA: Diagnosis present

## 2022-03-20 DIAGNOSIS — F1991 Other psychoactive substance use, unspecified, in remission: Secondary | ICD-10-CM | POA: Insufficient documentation

## 2022-03-20 DIAGNOSIS — S21209A Unspecified open wound of unspecified back wall of thorax without penetration into thoracic cavity, initial encounter: Secondary | ICD-10-CM | POA: Insufficient documentation

## 2022-03-20 DIAGNOSIS — D649 Anemia, unspecified: Secondary | ICD-10-CM | POA: Insufficient documentation

## 2022-03-20 DIAGNOSIS — Z981 Arthrodesis status: Secondary | ICD-10-CM | POA: Diagnosis not present

## 2022-03-20 DIAGNOSIS — T847XXA Infection and inflammatory reaction due to other internal orthopedic prosthetic devices, implants and grafts, initial encounter: Principal | ICD-10-CM

## 2022-03-20 DIAGNOSIS — S31000A Unspecified open wound of lower back and pelvis without penetration into retroperitoneum, initial encounter: Secondary | ICD-10-CM | POA: Diagnosis present

## 2022-03-20 DIAGNOSIS — X58XXXD Exposure to other specified factors, subsequent encounter: Secondary | ICD-10-CM | POA: Diagnosis present

## 2022-03-20 DIAGNOSIS — T84226A Displacement of internal fixation device of vertebrae, initial encounter: Secondary | ICD-10-CM | POA: Diagnosis present

## 2022-03-20 DIAGNOSIS — F17200 Nicotine dependence, unspecified, uncomplicated: Secondary | ICD-10-CM | POA: Diagnosis present

## 2022-03-20 LAB — SEDIMENTATION RATE: Sed Rate: 51 mm/hr — ABNORMAL HIGH (ref 0–16)

## 2022-03-20 LAB — BASIC METABOLIC PANEL
Anion gap: 11 (ref 5–15)
BUN: 19 mg/dL (ref 6–20)
CO2: 26 mmol/L (ref 22–32)
Calcium: 8.7 mg/dL — ABNORMAL LOW (ref 8.9–10.3)
Chloride: 100 mmol/L (ref 98–111)
Creatinine, Ser: 0.92 mg/dL (ref 0.61–1.24)
GFR, Estimated: >60 mL/min (ref >60)
Glucose, Bld: 110 mg/dL — ABNORMAL HIGH (ref 70–99)
Potassium: 4 mmol/L (ref 3.5–5.1)
Sodium: 137 mmol/L (ref 135–145)

## 2022-03-20 LAB — CBC
HCT: 30.9 % — ABNORMAL LOW (ref 39.0–52.0)
Hemoglobin: 9.4 g/dL — ABNORMAL LOW (ref 13.0–17.0)
MCH: 23.5 pg — ABNORMAL LOW (ref 26.0–34.0)
MCHC: 30.4 g/dL (ref 30.0–36.0)
MCV: 77.3 fL — ABNORMAL LOW (ref 80.0–100.0)
Platelets: 480 10*3/uL — ABNORMAL HIGH (ref 150–400)
RBC: 4 MIL/uL — ABNORMAL LOW (ref 4.22–5.81)
RDW: 16.2 % — ABNORMAL HIGH (ref 11.5–15.5)
WBC: 8.7 10*3/uL (ref 4.0–10.5)
nRBC: 0 % (ref 0.0–0.2)

## 2022-03-20 LAB — RETICULOCYTES
Immature Retic Fract: 18.1 % — ABNORMAL HIGH (ref 2.3–15.9)
RBC.: 3.95 MIL/uL — ABNORMAL LOW (ref 4.22–5.81)
Retic Count, Absolute: 60.8 10*3/uL (ref 19.0–186.0)
Retic Ct Pct: 1.5 % (ref 0.4–3.1)

## 2022-03-20 LAB — IRON AND TIBC
Iron: 34 ug/dL — ABNORMAL LOW (ref 45–182)
Saturation Ratios: 7 % — ABNORMAL LOW (ref 17.9–39.5)
TIBC: 465 ug/dL — ABNORMAL HIGH (ref 250–450)
UIBC: 431 ug/dL

## 2022-03-20 LAB — FERRITIN: Ferritin: 14 ng/mL — ABNORMAL LOW (ref 24–336)

## 2022-03-20 LAB — C-REACTIVE PROTEIN: CRP: 0.9 mg/dL (ref <1.0)

## 2022-03-20 LAB — HIV ANTIBODY (ROUTINE TESTING W REFLEX): HIV Screen 4th Generation wRfx: NONREACTIVE

## 2022-03-20 MED ORDER — LACTATED RINGERS IV SOLN: INTRAVENOUS | Status: DC

## 2022-03-20 MED ORDER — DOCUSATE SODIUM 100 MG PO CAPS
100.0000 mg | ORAL_CAPSULE | Freq: Two times a day (BID) | ORAL | Status: DC
  Administered 2022-03-20 – 2022-03-21 (×2): 100 mg via ORAL
  Filled 2022-03-20 (×2): qty 1

## 2022-03-20 MED ORDER — ACETAMINOPHEN 650 MG RE SUPP: 650.0000 mg | Freq: Four times a day (QID) | RECTAL | Status: DC | PRN

## 2022-03-20 MED ORDER — TRAZODONE HCL 100 MG PO TABS
100.0000 mg | ORAL_TABLET | Freq: Every day | ORAL | Status: DC
  Administered 2022-03-20: 100 mg via ORAL
  Filled 2022-03-20: qty 1

## 2022-03-20 MED ORDER — ONDANSETRON HCL 4 MG/2ML IJ SOLN: 4.0000 mg | Freq: Four times a day (QID) | INTRAMUSCULAR | Status: DC | PRN

## 2022-03-20 MED ORDER — ORAL CARE MOUTH RINSE: 15.0000 mL | OROMUCOSAL | Status: DC | PRN

## 2022-03-20 MED ORDER — QUETIAPINE FUMARATE 50 MG PO TABS
75.0000 mg | ORAL_TABLET | Freq: Every day | ORAL | Status: DC
  Administered 2022-03-20: 75 mg via ORAL
  Filled 2022-03-20: qty 2

## 2022-03-20 MED ORDER — BUPRENORPHINE HCL 8 MG SL SUBL
8.0000 mg | SUBLINGUAL_TABLET | Freq: Two times a day (BID) | SUBLINGUAL | Status: DC
  Administered 2022-03-20 – 2022-03-21 (×2): 8 mg via SUBLINGUAL
  Filled 2022-03-20 (×2): qty 1

## 2022-03-20 MED ORDER — KETOROLAC TROMETHAMINE 15 MG/ML IJ SOLN
15.0000 mg | Freq: Three times a day (TID) | INTRAMUSCULAR | Status: DC | PRN
  Administered 2022-03-21: 15 mg via INTRAVENOUS
  Filled 2022-03-20: qty 1

## 2022-03-20 MED ORDER — PANTOPRAZOLE SODIUM 40 MG PO TBEC
40.0000 mg | DELAYED_RELEASE_TABLET | Freq: Every day | ORAL | Status: DC
  Administered 2022-03-20 – 2022-03-21 (×2): 40 mg via ORAL
  Filled 2022-03-20 (×2): qty 1

## 2022-03-20 MED ORDER — ONDANSETRON HCL 4 MG PO TABS: 4.0000 mg | ORAL_TABLET | Freq: Four times a day (QID) | ORAL | Status: DC | PRN

## 2022-03-20 MED ORDER — SENNA 8.6 MG PO TABS
1.0000 | ORAL_TABLET | Freq: Two times a day (BID) | ORAL | Status: DC
  Administered 2022-03-20 – 2022-03-21 (×2): 8.6 mg via ORAL
  Filled 2022-03-20 (×2): qty 1

## 2022-03-20 MED ORDER — ACETAMINOPHEN 325 MG PO TABS: 650.0000 mg | ORAL_TABLET | Freq: Four times a day (QID) | ORAL | Status: DC | PRN

## 2022-03-20 MED ORDER — ADULT MULTIVITAMIN W/MINERALS CH
1.0000 | ORAL_TABLET | Freq: Every day | ORAL | Status: DC
  Administered 2022-03-20 – 2022-03-21 (×2): 1 via ORAL
  Filled 2022-03-20 (×2): qty 1

## 2022-03-20 MED ORDER — ENOXAPARIN SODIUM 40 MG/0.4ML IJ SOSY
40.0000 mg | PREFILLED_SYRINGE | INTRAMUSCULAR | Status: DC
  Administered 2022-03-20 – 2022-03-21 (×2): 40 mg via SUBCUTANEOUS
  Filled 2022-03-20 (×2): qty 0.4
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
## 2022-03-20 NOTE — Progress Notes (Signed)
1335:  Transfer hospital called and attempted to give report on patient, RN was not able to take report due to being in an isolation room performing patient care.  Unit secretary took message with RNs name and contact number from transfer facility.  1345:  Called transfer facility for report, [facility] phone # [phone].  No answer, no voicemail.   1410: Called transfer facility back for report, report obtained from [facility] RN.

## 2022-03-21 DIAGNOSIS — T847XXA Infection and inflammatory reaction due to other internal orthopedic prosthetic devices, implants and grafts, initial encounter: Secondary | ICD-10-CM | POA: Diagnosis not present

## 2022-03-21 LAB — FOLATE: Folate: 18.1 ng/mL (ref >5.9)

## 2022-03-21 LAB — VITAMIN B12: Vitamin B-12: 230 pg/mL (ref 180–914)

## 2022-03-21 MED ORDER — VITAMIN B-12 100 MCG PO TABS
100.0000 ug | ORAL_TABLET | Freq: Every day | ORAL | Status: DC
  Administered 2022-03-21: 100 ug via ORAL
  Filled 2022-03-21: qty 1
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
## 2022-03-21 NOTE — Progress Notes (Addendum)
 (  Remote coverage notification)                                                    Against Medical Advice Patient at this time expresses desire to leave the Hospital immediately, patient has been warned that this is not Medically advisable at this time, and can result in Medical complications like Death and Disability, patient understands and accepts the risks involved and assumes full responsibilty of this decision.  This patient has also been advised that if they feel the need for further medical assistance to return to any available ER or dial [phone].  Informed by Nursing staff that this patient has left care and has signed the form  Against Medical Advice on 03/21/2022 at  1935 hrs.  Chinita Greenland BSN MSNA MSN ACNPC-AG Acute Care Nurse Practitioner [facility]

## 2022-03-21 NOTE — Progress Notes (Signed)
Patient left against medical advise at approximately 1935, remain alert and verbally responsive. PT understand and accept responsibility of this decision.

## 2022-03-22 DIAGNOSIS — T847XXD Infection and inflammatory reaction due to other internal orthopedic prosthetic devices, implants and grafts, subsequent encounter: Secondary | ICD-10-CM

## 2022-03-25 LAB — CULTURE, BLOOD (ROUTINE X 2)
Culture: NO GROWTH
Culture: NO GROWTH

## 2022-03-25 IMAGING — MR MR LUMBAR SPINE WO/W CM
6 of 7 series · 32 of 48 positions shown · IV contrast (gadavist)
Comparison: Operative images same day.  CT yesterday.

CLINICAL DATA: Back pain.  Previous surgery T11 through L3.

EXAM:
MRI LUMBAR SPINE WITHOUT AND WITH CONTRAST
TECHNIQUE: Multiplanar and multiecho pulse sequences of the lumbar spine were
obtained without and with intravenous contrast.
CONTRAST:  6mL GADAVIST GADOBUTROL 1 MMOL/ML IV SOLN

[Series 5: T2 · sagittal · 4.0mm · 0.92mm/px · 4 of 16 slices shown (1 of 2)]
[im 1/16]
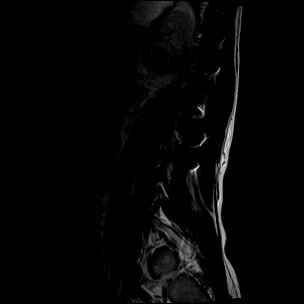
[im 6/16]
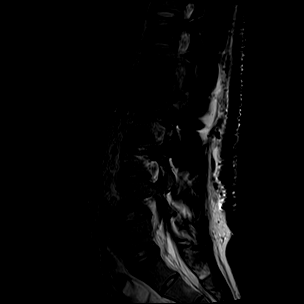
[im 11/16]
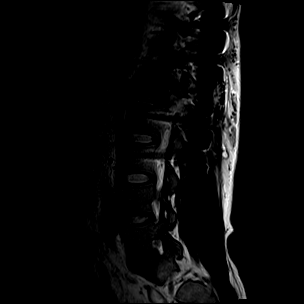
[im 16/16]
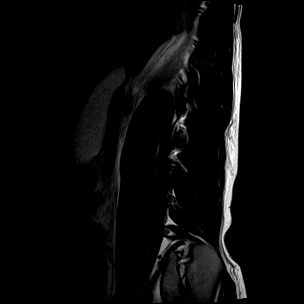

[Series 6: STIR · sagittal · 4.0mm · 0.55mm/px · 2 of 16 slices shown]
[im 1/16]
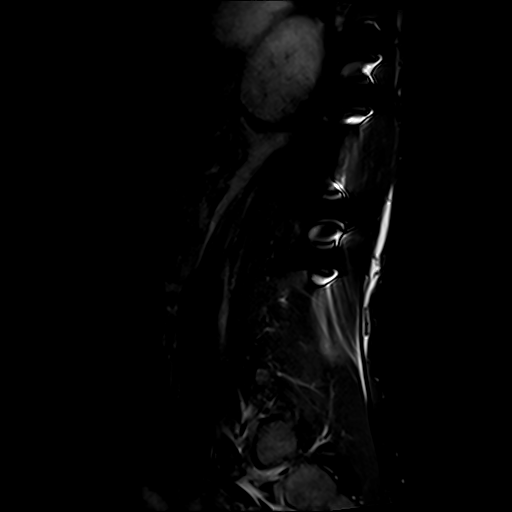
[im 6/16]
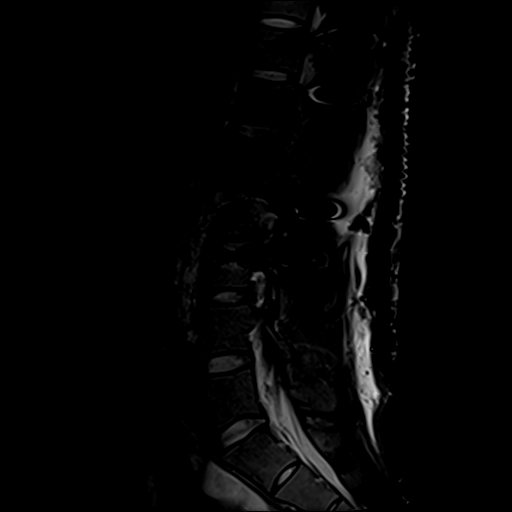

[Series 7: T1 · sagittal · 4.0mm · 1.09mm/px · 5 of 16 slices shown (1 of 2)]
[im 1/16]
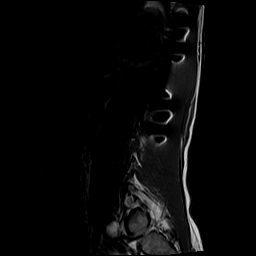
[im 4/16]
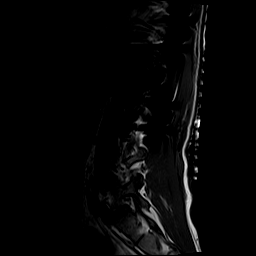
[im 8/16]
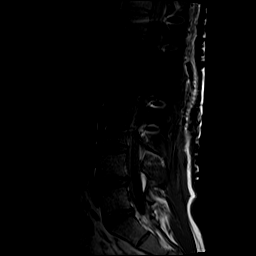
[im 12/16]
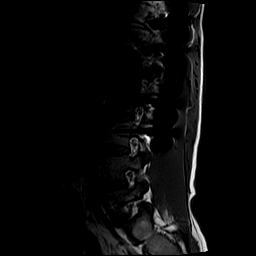
[im 16/16]
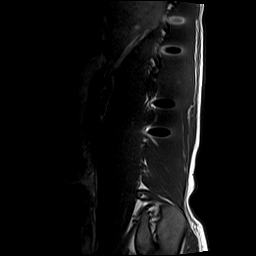

[Series 8: T2 · axial · 4.0mm · 0.72mm/px · z∈[-156,+59]mm · 8 of 34 slices shown (2 of 2)]
[im 1/34]
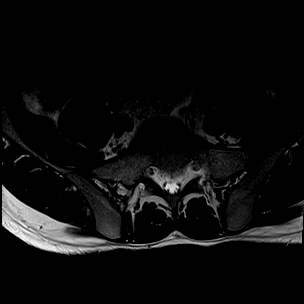
[im 4/34]
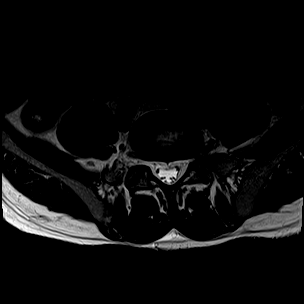
[im 12/34]
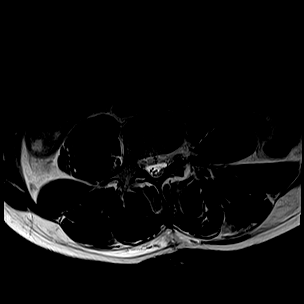
[im 15/34]
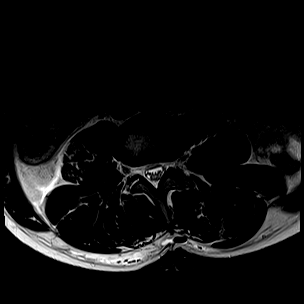
[im 19/34]
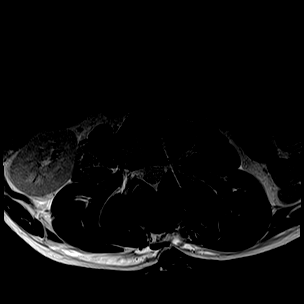
[im 23/34]
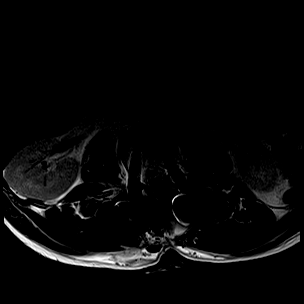
[im 30/34]
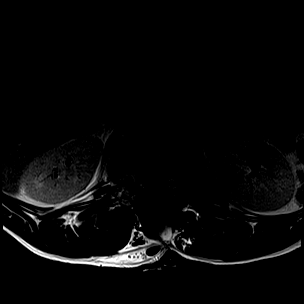
[im 34/34]
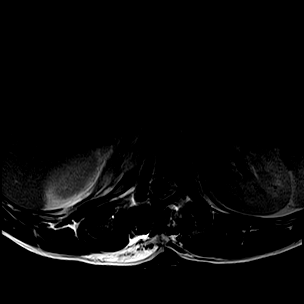

[Series 9: T1 · axial · 4.0mm · 0.40mm/px · z∈[-156,+59]mm · 8 of 34 slices shown (2 of 2)]
[im 1/34]
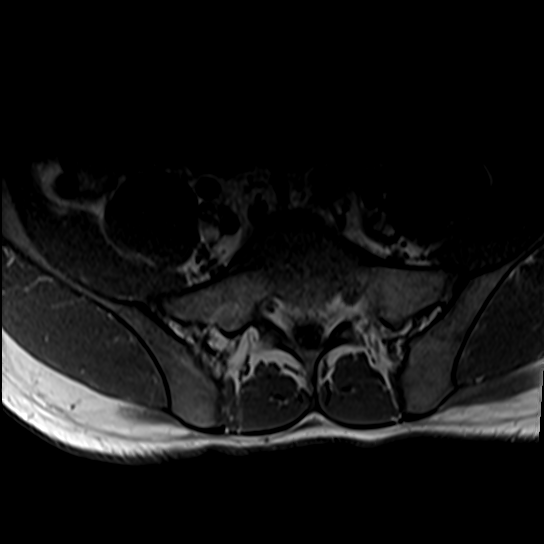
[im 4/34]
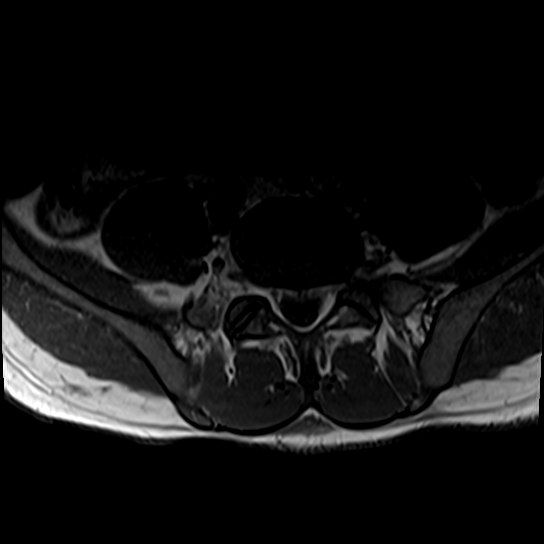
[im 12/34]
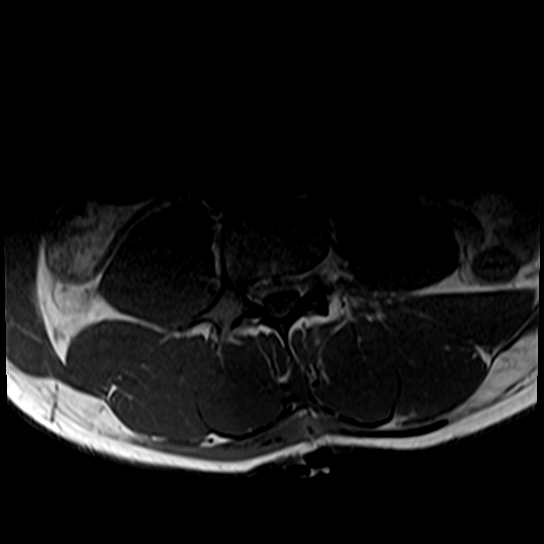
[im 15/34]
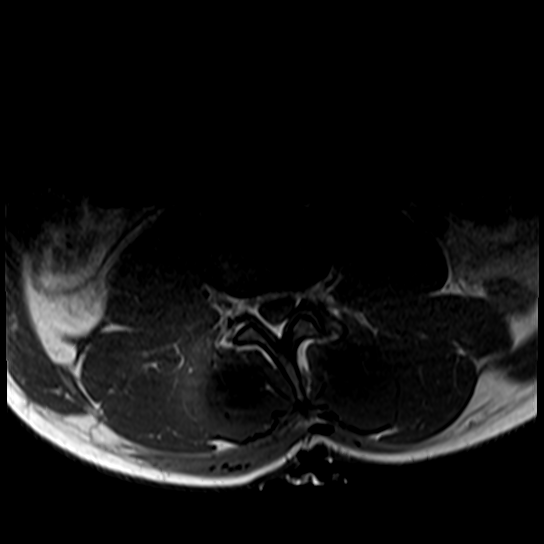
[im 19/34]
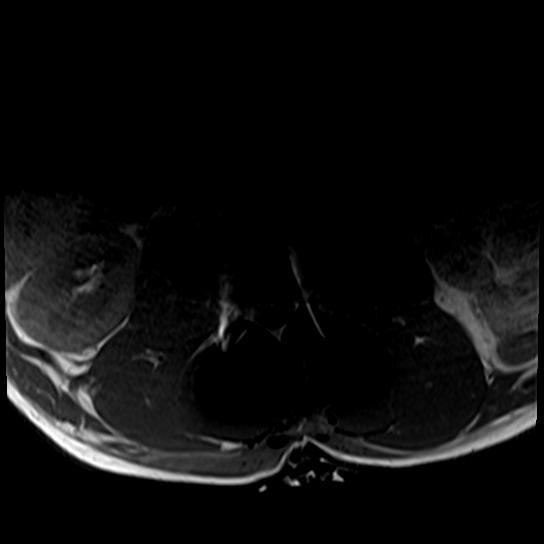
[im 23/34]
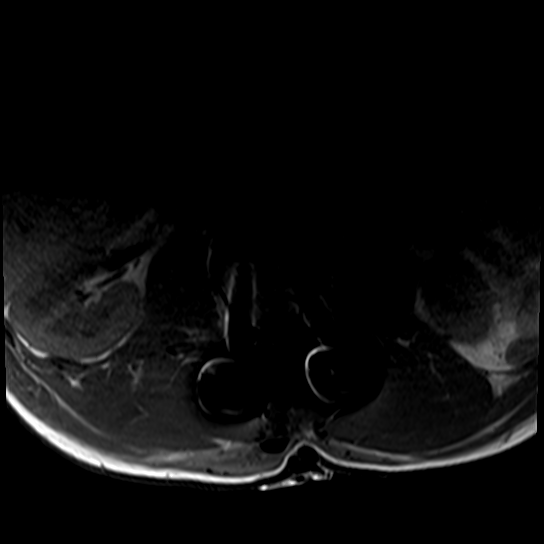
[im 30/34]
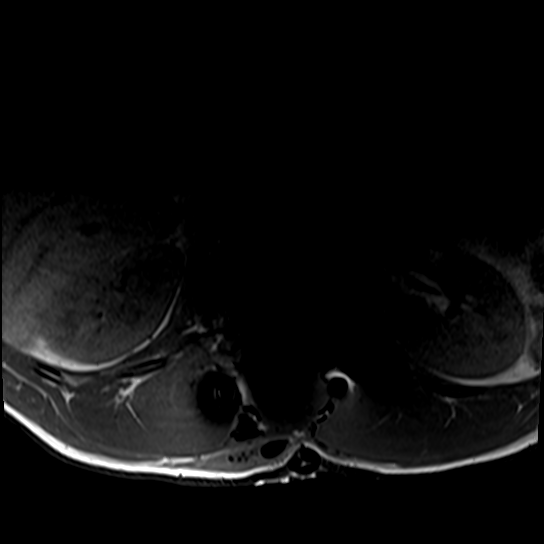
[im 34/34]
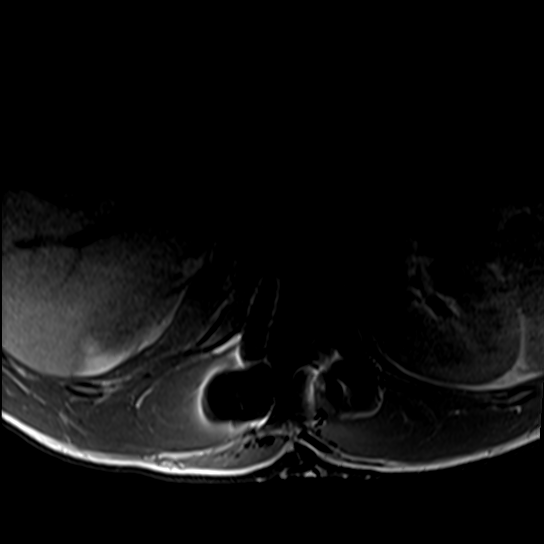

[Series 10: T1 fat-sat post-contrast · sagittal · 4.0mm · 0.97mm/px · 5 of 16 slices shown]
[im 1/16]
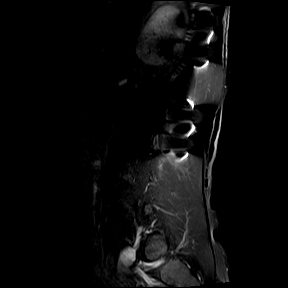
[im 4/16]
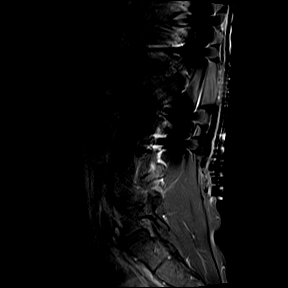
[im 8/16]
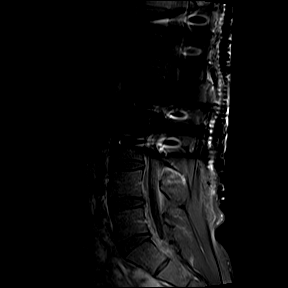
[im 12/16]
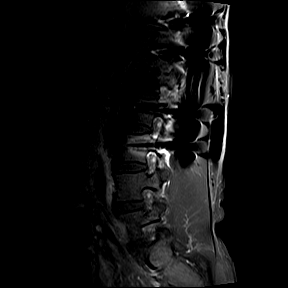
[im 16/16]
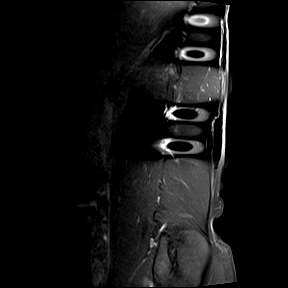

[32 of 48 positions shown; findings below may reference images not displayed]

FINDINGS: Segmentation: 5 lumbar type vertebral bodies as numbered previously.

Alignment:  No malalignment.

Vertebrae: Status post decompression at the L1 level with fusion
from T11-L3. Pedicle screws and posterior rods at T11, T12, L2 and
L3. Comminuted burst compression fracture of L1. Retropulsed bone
with 7 mm retropulsion limited detail at this level because of
artifact from the fusion hardware. There appears to have been
posterior decompression. The thecal sac is deviated posteriorly by
the retropulsed bone. I cannot accurately evaluate the distal
cord/conus in this region because of the artifact.

Paraspinal and other soft tissues: Edematous changes due to the L1
fracture and surgical procedure.

Disc levels:

No primary disc pathology.  No traumatic disc herniation.
IMPRESSION: Status post posterior decompression at the L1 level with fusion from
T11-L3. Retropulsed bone at the site of an L1 burst compression
fracture with 7 mm retropulsion. Limited detail at this level
because of artifact from the fusion hardware.
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
